# Patient Record
Sex: Male | Born: 1950 | Race: White | Hispanic: No | Marital: Married | Smoking: Never smoker
Health system: Southern US, Community
[De-identification: ages and names within clinical notes are randomized; demographics above are authoritative.]

## PROBLEM LIST (undated history)

## (undated) DIAGNOSIS — F32A Depression, unspecified: Secondary | ICD-10-CM

## (undated) DIAGNOSIS — I10 Essential (primary) hypertension: Secondary | ICD-10-CM

## (undated) DIAGNOSIS — F329 Major depressive disorder, single episode, unspecified: Secondary | ICD-10-CM

## (undated) DIAGNOSIS — I809 Phlebitis and thrombophlebitis of unspecified site: Secondary | ICD-10-CM

## (undated) HISTORY — DX: Phlebitis and thrombophlebitis of unspecified site: I80.9

## (undated) HISTORY — DX: Essential (primary) hypertension: I10

## (undated) HISTORY — DX: Depression, unspecified: F32.A

## (undated) HISTORY — DX: Major depressive disorder, single episode, unspecified: F32.9

---

## 1986-04-03 HISTORY — PX: APPENDECTOMY: SHX54

## 2006-04-19 ENCOUNTER — Ambulatory Visit: Payer: Self-pay | Admitting: Family Medicine

## 2006-04-20 ENCOUNTER — Encounter: Payer: Self-pay | Admitting: Vascular Surgery

## 2006-04-20 ENCOUNTER — Ambulatory Visit (HOSPITAL_COMMUNITY): Admission: RE | Admit: 2006-04-20 | Discharge: 2006-04-20 | Payer: Self-pay | Admitting: Family Medicine

## 2006-06-28 LAB — CONVERTED CEMR LAB
Cholesterol: 241 mg/dL
HDL: 47 mg/dL
LDL Cholesterol: 174 mg/dL

## 2008-02-18 LAB — CONVERTED CEMR LAB
HDL: 55 mg/dL
LDL Cholesterol: 155 mg/dL

## 2008-02-24 ENCOUNTER — Ambulatory Visit: Payer: Self-pay | Admitting: Family Medicine

## 2008-02-24 DIAGNOSIS — I10 Essential (primary) hypertension: Secondary | ICD-10-CM

## 2008-02-24 DIAGNOSIS — E785 Hyperlipidemia, unspecified: Secondary | ICD-10-CM | POA: Insufficient documentation

## 2008-06-25 LAB — CONVERTED CEMR LAB
Creatinine, Ser: 1.26 mg/dL
HDL: 49 mg/dL
Hemoglobin: 14.7 g/dL
PSA: 1.26 ng/mL
Triglyceride fasting, serum: 82 mg/dL

## 2008-09-21 ENCOUNTER — Encounter: Payer: Self-pay | Admitting: Family Medicine

## 2008-10-15 ENCOUNTER — Ambulatory Visit: Payer: Self-pay | Admitting: Family Medicine

## 2009-02-17 ENCOUNTER — Encounter (INDEPENDENT_AMBULATORY_CARE_PROVIDER_SITE_OTHER): Payer: Self-pay | Admitting: *Deleted

## 2009-03-01 ENCOUNTER — Encounter (INDEPENDENT_AMBULATORY_CARE_PROVIDER_SITE_OTHER): Payer: Self-pay | Admitting: *Deleted

## 2009-03-02 ENCOUNTER — Ambulatory Visit: Payer: Self-pay | Admitting: Internal Medicine

## 2009-03-19 ENCOUNTER — Ambulatory Visit: Payer: Self-pay | Admitting: Internal Medicine

## 2009-03-23 ENCOUNTER — Encounter: Payer: Self-pay | Admitting: Internal Medicine

## 2010-05-05 ENCOUNTER — Encounter: Payer: Self-pay | Admitting: *Deleted

## 2010-06-30 LAB — LIPID PANEL: Cholesterol: 221 mg/dL — AB (ref 0–200)

## 2010-08-17 ENCOUNTER — Ambulatory Visit (INDEPENDENT_AMBULATORY_CARE_PROVIDER_SITE_OTHER): Payer: 59 | Admitting: Family Medicine

## 2010-08-17 ENCOUNTER — Encounter: Payer: Self-pay | Admitting: Family Medicine

## 2010-08-17 DIAGNOSIS — I10 Essential (primary) hypertension: Secondary | ICD-10-CM

## 2010-08-17 DIAGNOSIS — Z8042 Family history of malignant neoplasm of prostate: Secondary | ICD-10-CM

## 2010-08-17 DIAGNOSIS — F329 Major depressive disorder, single episode, unspecified: Secondary | ICD-10-CM

## 2010-08-17 DIAGNOSIS — F3289 Other specified depressive episodes: Secondary | ICD-10-CM

## 2010-08-17 MED ORDER — HYDROCHLOROTHIAZIDE 25 MG PO TABS: 25.0000 mg | ORAL_TABLET | Freq: Every day | ORAL | Status: DC

## 2010-08-17 NOTE — Patient Instructions (Signed)
Continue to monitor your blood pressure.  If you would like to try an acei please call.  I'd recommend to continue to monitor your PSA for a sudden rise.  Keep up the biking and be safe!

## 2010-08-17 NOTE — Assessment & Plan Note (Signed)
Reasonable control with current medication.  Does have white coat rises. Discussed could try adding an acei if he desires.  Continue to monitor at home

## 2010-08-17 NOTE — Progress Notes (Signed)
  Subjective:    Patient ID: Edgar Maxwell, male    DOB: [birth year], 60 y.o.   MRN: [id number]  HPI  HYPERTENSION Disease Monitoring Blood pressure range-127-146/74-91 at home Chest pain- no      Dyspnea- no Medications Compliance- daily hctz but did not take this am Lightheadedness- no   Edema- no Exercising with biking regularly Working on weight loss  Prostate Cancer Risk Slowly rising PSA.  Strong family history.   Dicussed options for referral to urology.  He would like to watch  Hyperlipidemia No medications.  Watching his diet.   Results from recent blood work entered in abstract form.     Depression Improved with new medication. Feels well   Review of Systems Patient reports no  vision/ hearing changes,anorexia, weight change, fever ,adenopathy, persistant / recurrent hoarseness, swallowing issues, chest pain, edema,persistant / recurrent cough, hemoptysis, dyspnea(rest, exertional, paroxysmal nocturnal), gastrointestinal  bleeding (melena, rectal bleeding), abdominal pain, excessive heart burn, GU symptoms(dysuria, hematuria, pyuria, voiding/incontinence  Issues) syncope, focal weakness, severe memory loss, concerning skin lesions, depression, anxiety, abnormal bruising/bleeding, major joint swelling.       Objective:   Physical Exam    Neck:  No deformities, thyromegaly, masses, or tenderness noted.   Supple with full range of motion without pain. Heart - Regular rate and rhythm.  No murmurs, gallops or rubs.    Lungs:  Normal respiratory effort, chest expands symmetrically. Lungs are clear to auscultation, no crackles or wheezes. Abdomen: soft and non-tender without masses, organomegaly or hernias noted.  No guarding or rebound Rectal: normal size firm prostate without masses or irregularities Extremities:  No cyanosis, edema, or deformity noted with good range of motion of all major joints.   Skin:  Intact without suspicious lesions or rashes      Assessment & Plan:

## 2011-08-10 ENCOUNTER — Other Ambulatory Visit: Payer: Self-pay | Admitting: Family Medicine

## 2011-08-10 MED ORDER — HYDROCHLOROTHIAZIDE 25 MG PO TABS: 25.0000 mg | ORAL_TABLET | Freq: Every day | ORAL | Status: DC

## 2011-12-19 ENCOUNTER — Encounter: Payer: Self-pay | Admitting: Family Medicine

## 2012-03-13 ENCOUNTER — Ambulatory Visit (INDEPENDENT_AMBULATORY_CARE_PROVIDER_SITE_OTHER): Payer: 59 | Admitting: Family Medicine

## 2012-03-13 ENCOUNTER — Telehealth: Payer: Self-pay | Admitting: Family Medicine

## 2012-03-13 ENCOUNTER — Encounter: Payer: Self-pay | Admitting: Family Medicine

## 2012-03-13 ENCOUNTER — Ambulatory Visit (HOSPITAL_COMMUNITY)
Admission: RE | Admit: 2012-03-13 | Discharge: 2012-03-13 | Disposition: A | Discharge status: Home / Self Care | Payer: 59 | Source: Ambulatory Visit | Attending: Family Medicine | Admitting: Family Medicine

## 2012-03-13 VITALS — BP 144/77 | HR 84 | Temp 98.9°F | Ht 72.0 in | Wt 223.0 lb

## 2012-03-13 DIAGNOSIS — I499 Cardiac arrhythmia, unspecified: Secondary | ICD-10-CM | POA: Insufficient documentation

## 2012-03-13 DIAGNOSIS — R002 Palpitations: Secondary | ICD-10-CM | POA: Insufficient documentation

## 2012-03-13 LAB — BASIC METABOLIC PANEL
BUN: 18 mg/dL (ref 6–23)
Calcium: 9.1 mg/dL (ref 8.4–10.5)
Creat: 0.93 mg/dL (ref 0.50–1.35)
Glucose, Bld: 107 mg/dL — ABNORMAL HIGH (ref 70–99)

## 2012-03-13 NOTE — Assessment & Plan Note (Addendum)
He has had 2 episodes of heart palpitations lasting 5-10 minutes over the past 24 hours. His ECG today in clinic (when he was not experiencing symptoms) was normal.  We will check BMET and TSH to rule-out electrolyte and thyroid abnormalities.  We discussed Holter monitoring/cardiology referral. He would like to defer at this time; he will consider if palpitations persist.  Also on differential is recent stress (string of bad call nights) which may be contributing to symptoms.

## 2012-03-13 NOTE — Telephone Encounter (Signed)
Dr Raymon Mutton called is having frequent episode of palpitations over the last few days.  No chest pain or shortness of breath but is bothersome.  Suggested that he be seen today for an ECG and probable blood tests given his hypertension and diuretic usage  He agrees

## 2012-03-13 NOTE — Progress Notes (Signed)
  Subjective:    Patient ID: Edgar Maxwell, male    DOB: 1951/02/09, 61 y.o.   MRN: [id number]  HPI # Palpitations  He comes in today after experiencing his second episode of palpitations.  He had his first last night while at home while he was walking down the hall. It did not improve with rest. He is wondering if he was having PVCs or PACs at that time; he felt his heartbeat was rapid. It resolved after about 5-10 minutes.  His second episode occurred today while sitting at his desk at work. He denies inciting or alleviating factors. It spontaneously resolved after about 5-10 minutes.   He denies new medications.  He is compliant with HCTZ for blood pressure.   He denies any symptoms at this time. During time of episode, he denies chest pain, dyspnea, nausea, leg swelling or pain. He denies fevers/chills/constipation/abdominal pain.   He is wondering if his symptoms could be attributable due to stress. He has had a series of bad call nights recently (he is one of the Pediatric ICU attendings).  Review of Systems Per HPI   Allergies, medication, past medical history reviewed.  Depression--he reports good control. Lamictal added to Pristiq in January.   Pertinent family history: Denies history of cardiac disease before the age of 37. Grandfather died from MI mid to late 56s.  Father diagnosed with AF age 29 and has questionable diagnosis of HOCUM.     Objective:   Physical Exam GEN: NAD; well-nourished, -appearing PSYCH: pleasant, normally socially appropriate CV: RRR, normal S1/S2, no murmurs or gallops PULM: NI WOB; CTAB without rales EXT: no edema NEURO: grossly normal  ECG: normal sinus rhythm  Rate: regular Rhythm: regular Axis: normal left-axis Intervals:    PR (normal 0.12-0.20): normal P-wave (normal <0.12 duration, 0.25 mv amplitude): normal QRS complex (normal 0.06-0.10): normal  QTc: 446 no pathologic Q waves, signs of BBB, or hypertrophy ST-T waves: no elevated or  depressed    Assessment & Plan:

## 2012-03-13 NOTE — Patient Instructions (Addendum)
We will call you with the lab results.   If this occurs again, we may consider a Holter monitor.

## 2012-03-14 ENCOUNTER — Telehealth: Payer: Self-pay | Admitting: Family Medicine

## 2012-03-14 LAB — TSH: TSH: 2.079 u[IU]/mL (ref 0.350–4.500)

## 2012-03-14 NOTE — Telephone Encounter (Signed)
Notified of normal BMET and TSH.   He is feeling okay. He did have another small episode this morning, but his symptoms were not as severe and he did not get lightheaded.   He drinks about 2 cups of coffee daily. He has not changed his intake recently. He is wondering also if this could be contributing to symptoms.   He was advised to follow-up as needed if episodes persist or symptoms worsen.

## 2012-05-18 ENCOUNTER — Other Ambulatory Visit: Payer: Self-pay

## 2012-07-22 ENCOUNTER — Encounter: Payer: Self-pay | Admitting: Family Medicine

## 2012-07-22 ENCOUNTER — Telehealth: Payer: Self-pay | Admitting: Family Medicine

## 2012-07-22 MED ORDER — LAMOTRIGINE 100 MG PO TABS: 100.0000 mg | ORAL_TABLET | Freq: Every day | ORAL | Status: DC

## 2012-07-22 MED ORDER — HYDROCHLOROTHIAZIDE 25 MG PO TABS: 25.0000 mg | ORAL_TABLET | Freq: Every day | ORAL | Status: DC

## 2012-07-22 NOTE — Telephone Encounter (Signed)
Hi Lee,  Hope you are doing well. I have had no further episodes of palpitations. Job related stress, I assume.  Would you please send a couple of 'scrips to the out-patient pharmacy for me?  HCTZ 25 mg qd and Lamotrigine 100 mg qd  Thanks so much,  [name]

## 2012-07-23 ENCOUNTER — Encounter: Payer: Self-pay | Admitting: *Deleted

## 2012-07-24 ENCOUNTER — Encounter: Payer: Self-pay | Admitting: Family Medicine

## 2012-08-28 ENCOUNTER — Ambulatory Visit (INDEPENDENT_AMBULATORY_CARE_PROVIDER_SITE_OTHER): Payer: 59 | Admitting: Internal Medicine

## 2012-08-28 ENCOUNTER — Encounter: Payer: Self-pay | Admitting: Internal Medicine

## 2012-08-28 ENCOUNTER — Encounter (INDEPENDENT_AMBULATORY_CARE_PROVIDER_SITE_OTHER): Payer: 59

## 2012-08-28 VITALS — BP 150/98 | HR 69 | Ht 72.0 in | Wt 233.0 lb

## 2012-08-28 DIAGNOSIS — E785 Hyperlipidemia, unspecified: Secondary | ICD-10-CM

## 2012-08-28 DIAGNOSIS — I421 Obstructive hypertrophic cardiomyopathy: Secondary | ICD-10-CM

## 2012-08-28 DIAGNOSIS — R002 Palpitations: Secondary | ICD-10-CM

## 2012-08-28 DIAGNOSIS — Z8249 Family history of ischemic heart disease and other diseases of the circulatory system: Secondary | ICD-10-CM

## 2012-08-28 DIAGNOSIS — I440 Atrioventricular block, first degree: Secondary | ICD-10-CM

## 2012-08-28 DIAGNOSIS — I10 Essential (primary) hypertension: Secondary | ICD-10-CM

## 2012-08-28 DIAGNOSIS — I471 Supraventricular tachycardia: Secondary | ICD-10-CM

## 2012-08-28 NOTE — Patient Instructions (Addendum)
Your physician has requested that you have an echocardiogram. Echocardiography is a painless test that uses sound waves to create images of your heart. It provides your doctor with information about the size and shape of your heart and how well your heart's chambers and valves are working. This procedure takes approximately one hour. There are no restrictions for this procedure.  Your physician has recommended that you wear a 30 day event monitor. Event monitors are medical devices that record the heart's electrical activity. Doctors most often [place] these monitors to diagnose arrhythmias. Arrhythmias are problems with the speed or rhythm of the heartbeat. The monitor is a small, portable device. You can wear one while you do your normal daily activities. This is usually used to diagnose what is causing palpitations/syncope (passing out).  Your physician recommends that you schedule a follow-up appointment as needed.

## 2012-08-28 NOTE — Progress Notes (Signed)
ELECTROPHYSIOLOGY CONSULT NOTE  Patient ID: Edgar Maxwell, MRN: [id number], DOB/AGE: Oct 09, 1950 62 y.o. Admit date: (Not on file) Date of Consult: 08/28/2012  Primary Physician: Carney Living, MD Primary Cardiologist: new  Chief Complaint    HPI Edgar Maxwell is a 62 y.o. male ( note abbreviated b/c written one week late--i will need to clarify with pt the details) Presented with tachypalps precipitated by biking-which he does avidly. HR monitor showed abrupt elevations in HR that were persistent but not uniform suggesting irregularity, these measured off his GPS  He also has a palpitation syndrome which has not been assoc with exertion, typically lasting 5-10 min.  These were both irregular and rapid  He has some post exercise orthostatic lightheadedness, but not with exertion  He has some HTN for which he is treated.  Family Hx ? HCM   Prior hx of some mild edema    Past Medical History  Diagnosis Date  . Phlebitis       Surgical History: No past surgical history on file.   Home Meds: Prior to Admission medications   Medication Sig Start Date End Date Taking? Authorizing Provider  desvenlafaxine (PRISTIQ) 50 MG 24 hr tablet Take 50 mg by mouth daily.     Yes Historical Provider, MD  hydrochlorothiazide (HYDRODIURIL) 25 MG tablet Take 1 tablet (25 mg total) by mouth daily. Every morning 07/22/12  Yes Carney Living, MD  lamoTRIgine (LAMICTAL) 100 MG tablet Take 1 tablet (100 mg total) by mouth daily. 07/22/12  Yes Carney Living, MD     Allergies: No Known Allergies  History   Social History  . Marital Status: Married    Spouse Name: Edgar Maxwell    Number of Children: Edgar Maxwell  . Years of Education: Edgar Maxwell   Occupational History  . Not on file.   Social History Main Topics  . Smoking status: Former Games developer  . Smokeless tobacco: Not on file  . Alcohol Use: Not on file  . Drug Use: Not on file  . Sexually Active: Not on file   Other Topics Concern  . 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   Remarried - wife works at [employer] - Bilateral mastectomy for cancer in 2011   Has three daughters born in mid- late 53s - 2 at [employer] in [place] in 2012   Exwife in [place]     Family History  Problem Relation Age of Onset  . Prostate cancer Father 65    Died of prostate cancer  . Atrial fibrillation Father     normal coronaries  . Hypertension Father     and brother     ROS:  Please see the history of present illness.     All other systems reviewed and negative.    Physical Exam   Height 6' (1.829 m), weight 233 lb (105.688 kg). BP 150/98  Pulse 69  Ht 6' (1.829 m)  Wt 233 lb (105.688 kg)  BMI 31.59 kg/m2  SpO2 94%  General: Well developed, well nourished male in no acute distress. Head: Normocephalic, atraumatic, sclera non-icteric, no xanthomas, nares are without discharge. EENT: normal Lymph Nodes:  none Back: without scoliosis/kyphosis , no CVA tendersness Neck: Negative for carotid bruits. JVD not elevated. Lungs: Clear bilaterally to auscultation without wheezes, rales, or rhonchi. Breathing is unlabored. Heart: Irregular pulse with S1 S2. No   murmur , rubs, or gallops appreciated. Abdomen: Soft, non-tender, non-distended with normoactive  bowel sounds. No hepatomegaly. No rebound/guarding. No obvious abdominal masses. Msk:  Strength and tone appear normal for age. Extremities: No clubbing or cyanosis. No edema.  Distal pedal pulses are 2+ and equal bilaterally. Skin: Warm and Dry Neuro: Alert and oriented X 3. CN III-XII intact Grossly normal sensory and motor function . Psych:  Responds to questions appropriately with a normal affect.      Labs: Cardiac Enzymes No results found for this basename: CKTOTAL, CKMB, TROPONINI,  in the last 72 hours CBC Lab Results  Component Value Date   HGB 14.7 06/25/2008   PROTIME: No results found for this basename: LABPROT, INR,  in the last  72 hours Chemistry No results found for this basename: NA, K, CL, CO2, BUN, CREATININE, CALCIUM, LABALBU, PROT, BILITOT, ALKPHOS, ALT, AST, GLUCOSE,  in the last 168 hours Lipids Lab Results  Component Value Date   CHOL 221* 06/30/2010   HDL 45 06/30/2010   LDLCALC 146 06/30/2010   BNP No results found for this basename: probnp   Miscellaneous No results found for this basename: DDIMER    Radiology/Studies:  No results found.  EKG:NSR @ 67with freq PAC   Intervals 16/09/41  Assessment and Plan:    [name]

## 2012-08-29 ENCOUNTER — Encounter: Payer: Self-pay | Admitting: *Deleted

## 2012-08-29 ENCOUNTER — Other Ambulatory Visit: Payer: Self-pay

## 2012-08-29 DIAGNOSIS — I10 Essential (primary) hypertension: Secondary | ICD-10-CM

## 2012-08-29 DIAGNOSIS — I421 Obstructive hypertrophic cardiomyopathy: Secondary | ICD-10-CM

## 2012-08-29 NOTE — Progress Notes (Signed)
Patient ID: Edgar Maxwell, male   DOB: 20-Jun-1950, 62 y.o.   MRN: [id number] Patient enrolled with [employer] for 30 Day Event monitor.  Monitor to be mailed to patient.

## 2012-09-02 ENCOUNTER — Telehealth: Payer: Self-pay | Admitting: Family Medicine

## 2012-09-02 NOTE — Telephone Encounter (Signed)
Edgar Maxwell,  That all sounds perfectly reasonable. As luck would have it, I cycled the last three days for a total of about three hours with max heart rates over 160 each time (the usual trigger for my arrhythmia). I stayed in sinus rhythm the whole time. I did have some post-ride palpitations once - interested to know if it was PACs or PVCs. Hopefully the event recorder will tell. No dizziness either.  My cardiac echo is next Tuesday. I'll keep you updated.  Thanks,  Edgar Maxwell    From: Edgar Maxwell  Sent: Monday, September 02, 2012 9:12 AM To: Edgar Maxwell Subject: RE: medical updates  Edgar Maxwell  Sorry to hear about all this.  I haven't gotten any notes as best I can tell Edgar Maxwell has not finished his yet.  Yes I agree better control would likely be beneficial.    We probably ought to wait for the echo and monitor results, if you are having that soon, before choosing a specific bp med.  As to cholesterol yes the new guidelines are a bit of a change.   You probably need to be on a moderate dose statin - likely Lipitor.  Perhaps you can schedule an vist with me once you finish your cardiac workup?  I am certainly open to what Edgar Maxwell would suggest also  Does that sound like an ok approach?    Thanks  Edgar Maxwell  From: Edgar Maxwell  Sent: Friday, Aug 30, 2012 11:46 AM To: Edgar Maxwell Subject: medical updates  Hi Edgar Maxwell  [facility] you are doing well. I don't know if you have gotten information via Epic about my recent cardiac issues. Several weeks ago I started having bouts of SVT while cycling (I always ride with a GPS/heart monitor device). They mostly happen early in rides with exertion (climbing hills) and are brief without other symptoms. That is until a more recent ride when I had multiple episodes and felt lousy afterwards. I was seen by Edgar Maxwell on [facility] 5/28. He has provided me a cardiac event monitor to wear while cycling (the only time it seems to happen). Due my father's history of possible  hypertrophic cardiomyopathy he has also scheduled an echocardiogram. If that is concerning or not definitive we will follow up with cardiac MRI. Of note, my father also had sudden onset of arrhythmia (atrial fib) at my current age. Edgar Maxwell thinks that is likely what I have given the variability in my heart rate during these episodes. I have also had some dizzy/near syncope spells, occurring hours after riding. My orthostatic testing was normal.  My initial BP was 148/92 (which, as always, is higher than my own BP checks at home). He thinks, and I agree, that we should probably be more aggressive about getting it under better control. Based on the new (and controversial) cardiac risk calculator, my 10 yr. risk is over 11%. In addition to better BP control, I think we should talk about getting my cholesterol under better control as well (total pretty consistent around 220 with HDL in low 50s).   Sorry to be so long-winded. Please let me know what you think. Thanks,  Edgar Maxwell

## 2012-09-05 DIAGNOSIS — R002 Palpitations: Secondary | ICD-10-CM | POA: Insufficient documentation

## 2012-09-05 DIAGNOSIS — Z8249 Family history of ischemic heart disease and other diseases of the circulatory system: Secondary | ICD-10-CM | POA: Insufficient documentation

## 2012-09-05 NOTE — Assessment & Plan Note (Addendum)
As above  If echo is equivocal , would pursue C MRI for reasons above  This diagnosis would also have an impact on issues related to hydration which relate to Dx o HCM Risk stratification with holter and GXT would also be appropriate if Dx is made Gene testing and cascade evaluation would follow

## 2012-09-05 NOTE — Assessment & Plan Note (Signed)
Used the new ASCVD risk estimator as an adjunct to the 2013 ACC/AHA Guideline on the Assessment of Cardiovascular Risk . This suggests a 10 year  11.6risk of hard cardiac event. Based on recommendations,  Low h intensity statin therapy is recommended  Will defer to dr [facility]

## 2012-09-05 NOTE — Assessment & Plan Note (Signed)
PT has documented by his GPS tachypalpitations which are non uniform in rate.  I suspect this is atrial fibrillation>> event recorder should be useful here and as these are mostly associated/triggered by exercise and we will use the device while riding.  I am also looking into the use of the mobile telemetry recorder that works with smart phones  If atrial fibrillation will need to address anticoagulation with CHADS score of 1.  The family history of HCM is important to explore as it would inform further decisions regarding anticoagulation and family assessment  To that end we will get an echo to look at LV and LA structure and function  It might also be useful to explore OSA given the close assoc of abd obesity, HTN afib and OSA

## 2012-09-10 ENCOUNTER — Ambulatory Visit (HOSPITAL_COMMUNITY): Payer: 59 | Attending: Cardiovascular Disease | Admitting: Radiology

## 2012-09-10 DIAGNOSIS — Z87891 Personal history of nicotine dependence: Secondary | ICD-10-CM | POA: Insufficient documentation

## 2012-09-10 DIAGNOSIS — I421 Obstructive hypertrophic cardiomyopathy: Secondary | ICD-10-CM | POA: Insufficient documentation

## 2012-09-10 DIAGNOSIS — E785 Hyperlipidemia, unspecified: Secondary | ICD-10-CM | POA: Insufficient documentation

## 2012-09-10 DIAGNOSIS — I10 Essential (primary) hypertension: Secondary | ICD-10-CM | POA: Insufficient documentation

## 2012-09-10 DIAGNOSIS — I471 Supraventricular tachycardia, unspecified: Secondary | ICD-10-CM | POA: Insufficient documentation

## 2012-09-10 NOTE — Progress Notes (Signed)
Echocardiogram performed.  

## 2012-09-12 ENCOUNTER — Encounter: Payer: Self-pay | Admitting: Pediatrics

## 2012-09-16 ENCOUNTER — Encounter: Payer: Self-pay | Admitting: Family Medicine

## 2012-09-16 ENCOUNTER — Telehealth: Payer: Self-pay | Admitting: Internal Medicine

## 2012-09-16 ENCOUNTER — Ambulatory Visit (INDEPENDENT_AMBULATORY_CARE_PROVIDER_SITE_OTHER): Payer: 59 | Admitting: Family Medicine

## 2012-09-16 VITALS — BP 147/93 | HR 69 | Ht 72.0 in | Wt 227.0 lb

## 2012-09-16 DIAGNOSIS — I10 Essential (primary) hypertension: Secondary | ICD-10-CM

## 2012-09-16 DIAGNOSIS — E785 Hyperlipidemia, unspecified: Secondary | ICD-10-CM

## 2012-09-16 DIAGNOSIS — F329 Major depressive disorder, single episode, unspecified: Secondary | ICD-10-CM

## 2012-09-16 MED ORDER — ATORVASTATIN CALCIUM 40 MG PO TABS: 40.0000 mg | ORAL_TABLET | Freq: Every day | ORAL | Status: DC

## 2012-09-16 MED ORDER — LISINOPRIL 10 MG PO TABS: 10.0000 mg | ORAL_TABLET | Freq: Every day | ORAL | Status: DC

## 2012-09-16 NOTE — Telephone Encounter (Signed)
Pt given results of recent echo.

## 2012-09-16 NOTE — Telephone Encounter (Signed)
In response to Dr Odessa Fleming question about family history of HCM pt states many years ago it was questionable in his father, never confirmed. Pt also asking, based on results of echo how does he proceed from here? Does Dr Graciela Husbands want to recommend Cardiac MRI? I will forward to Dr Graciela Husbands.

## 2012-09-16 NOTE — Patient Instructions (Addendum)
Come in for a bmet in 1 week after taking lisinopril  Come in for a fasting cholesterol in 6 weeks  Call or email if blood pressure is not almost always < 140/90  Geoffery Lyons or [employer]

## 2012-09-16 NOTE — Telephone Encounter (Signed)
Follow Up ° ° ° ° °Following up on test results. Please call. °

## 2012-09-17 NOTE — Progress Notes (Signed)
  Subjective:    Patient ID: Edgar Maxwell, male    DOB: 1950/10/13, 62 y.o.   MRN: [id number]  HPI  Tachycardia Having largely asymptomatic episode of tachycardia notes on his heart monitor when he bikes.   Has seen Dr Graciela Husbands for evaluation and echo  (see his notes).  Is now wearing an event recorder when rides but does not think he has had any episodes since wearing it.  No syncope or chest pain   HYPERTENSION Disease Monitoring Home BP Monitoring usually above 140/90 when seeing providers. Is lower at home.  He does not check at work Chest pain- no    Dyspnea- no Medications Compliance-  Daily hctz. Lightheadedness-  no  Edema- no ROS - See HPI  No history of angioedema  PMH Lab Review   Potassium  Date Value Range Status  03/13/2012 4.0  3.5 - 5.3 mEq/L Final     Sodium  Date Value Range Status  03/13/2012 141  135 - 145 mEq/L Final     Creat  Date Value Range Status  03/13/2012 0.93  0.50 - 1.35 mg/dL Final     Creatinine, Ser  Date Value Range Status  06/25/2008 1.26   Final       Cholesterol Never been treated with a statin.  Reports his 10 year calculated risk is 10%.  No liver problems.  No claudication or muscle aches.  Depression Seeing Dr Nolen Mu.  Concerned that pristiq maybe causing short term memory problems.  Lamotrigine seems to help mood stabilizing.  He would like to explore other opitons and referrals No indication of suicidal ideation   Review of Symptoms - see HPI  PMH - Smoking status noted.       Review of Systems     Objective:   Physical Exam no apparent distress        Assessment & Plan:

## 2012-09-17 NOTE — Assessment & Plan Note (Signed)
Given his risk of 10% will start moderate dose statin and monitor

## 2012-09-17 NOTE — Assessment & Plan Note (Signed)
Not at goal.  Will add acei and monitor blood pressure and labs.  May need betablocker depending on symptoms and heart monitor results

## 2012-09-17 NOTE — Assessment & Plan Note (Signed)
Not great control.  Gave names of possible providers

## 2012-09-27 ENCOUNTER — Other Ambulatory Visit: Payer: Self-pay | Admitting: Family Medicine

## 2012-09-28 LAB — TSH: TSH: 1.609 u[IU]/mL (ref 0.350–4.500)

## 2012-09-28 LAB — BASIC METABOLIC PANEL
BUN: 21 mg/dL (ref 6–23)
Calcium: 9.2 mg/dL (ref 8.4–10.5)
Creat: 0.92 mg/dL (ref 0.50–1.35)
Glucose, Bld: 106 mg/dL — ABNORMAL HIGH (ref 70–99)

## 2012-09-30 ENCOUNTER — Encounter: Payer: Self-pay | Admitting: Family Medicine

## 2012-09-30 ENCOUNTER — Telehealth: Payer: Self-pay | Admitting: Internal Medicine

## 2012-09-30 DIAGNOSIS — I421 Obstructive hypertrophic cardiomyopathy: Secondary | ICD-10-CM

## 2012-09-30 NOTE — Telephone Encounter (Signed)
Spoke with pt, he was following up with dr Graciela Husbands who told him he was going to have dr Regino Schultze at [facility] look at his echo. He also completed his monitor with no symptoms but since he stopped the monitor he has had episodes of heart rate of 200 while riding his bike, he is interested in loop recorder insertion. He also wants the number to billing regarding his bill from the monitor. Will discuss with dr Graciela Husbands and call the pt back.

## 2012-09-30 NOTE — Telephone Encounter (Signed)
New problem    Has question regarding echo results & event recorder .

## 2012-10-01 ENCOUNTER — Encounter: Payer: Self-pay | Admitting: Internal Medicine

## 2012-10-02 NOTE — Telephone Encounter (Signed)
Spoke with pt, per dr Graciela Husbands, order placed for cardiac MRI for HOCM.

## 2012-10-09 ENCOUNTER — Encounter: Payer: Self-pay | Admitting: Internal Medicine

## 2012-10-16 ENCOUNTER — Ambulatory Visit (HOSPITAL_COMMUNITY)
Admission: RE | Admit: 2012-10-16 | Discharge: 2012-10-16 | Disposition: A | Discharge status: Home / Self Care | Payer: 59 | Source: Ambulatory Visit | Attending: Internal Medicine | Admitting: Internal Medicine

## 2012-10-16 DIAGNOSIS — I4949 Other premature depolarization: Secondary | ICD-10-CM | POA: Insufficient documentation

## 2012-10-16 DIAGNOSIS — Z8249 Family history of ischemic heart disease and other diseases of the circulatory system: Secondary | ICD-10-CM | POA: Insufficient documentation

## 2012-10-16 DIAGNOSIS — I421 Obstructive hypertrophic cardiomyopathy: Secondary | ICD-10-CM

## 2012-10-16 MED ORDER — GADOBENATE DIMEGLUMINE 529 MG/ML IV SOLN
30.0000 mL | Freq: Once | INTRAVENOUS | Status: AC
  Administered 2012-10-16: 30 mL via INTRAVENOUS

## 2012-10-23 ENCOUNTER — Telehealth: Payer: Self-pay | Admitting: Internal Medicine

## 2012-10-23 ENCOUNTER — Encounter: Payer: Self-pay | Admitting: Internal Medicine

## 2012-10-23 NOTE — Telephone Encounter (Signed)
Spoke with pt, he would like to talk with dr Graciela Husbands regarding beta blocker and anticoagulation. Pt made aware have sent dr Graciela Husbands the info for he is in the hosp today doing procedures. Pt voiced understanding. Will wait ti hear from dr Graciela Husbands regarding med change.

## 2012-10-23 NOTE — Telephone Encounter (Signed)
New Prob     Pt has a questions regarding his last visit. Please page.

## 2012-10-25 ENCOUNTER — Telehealth: Payer: Self-pay | Admitting: Internal Medicine

## 2012-10-25 NOTE — Telephone Encounter (Signed)
New Prob     Pt would like to speak to nurse. Did not specify what call was regarding.

## 2012-10-25 NOTE — Telephone Encounter (Signed)
Spoke with dr Isidore Moos office, aware will have dr Graciela Husbands call him as soon as he gets to the office this afternoon after procedures.

## 2012-10-25 NOTE — Telephone Encounter (Signed)
SPOKE with Dr Raymon Mutton at his office CHADS-VASc score of 1-HTN   So no need for anticoagulation unless he has HCM   And have been in contact with Dr Regino Schultze at [facility] regarding the abnormal MRI  We will also arrange a visit with Dr Reyne Dumas at [facility] to discuss RFCA although the issue of HCM will inform this .

## 2012-11-26 ENCOUNTER — Encounter: Payer: Self-pay | Admitting: Family Medicine

## 2012-11-26 ENCOUNTER — Telehealth: Payer: Self-pay | Admitting: Internal Medicine

## 2012-11-26 DIAGNOSIS — I480 Paroxysmal atrial fibrillation: Secondary | ICD-10-CM | POA: Insufficient documentation

## 2012-11-26 NOTE — Telephone Encounter (Signed)
New Prob  Pt wants to speak with you. He did not say what it was concerning. He asked if you could page him, when you get a chance.

## 2012-11-26 NOTE — Telephone Encounter (Signed)
Event monitor end of service report placed in medical records for faxing.

## 2012-11-26 NOTE — Telephone Encounter (Signed)
Spoke with pt, he saw dr Macon Large at [facility] yesterday. They need [place] to fax an EKG that clearly shows atrial fib. Fax number is [phone].

## 2013-02-06 ENCOUNTER — Other Ambulatory Visit: Payer: Self-pay

## 2013-05-13 ENCOUNTER — Other Ambulatory Visit: Payer: Self-pay | Admitting: Family Medicine

## 2013-07-15 ENCOUNTER — Other Ambulatory Visit: Payer: Self-pay | Admitting: Family Medicine

## 2013-07-15 MED ORDER — DESVENLAFAXINE SUCCINATE ER 50 MG PO TB24: 50.0000 mg | ORAL_TABLET | Freq: Every day | ORAL | Status: DC

## 2013-08-04 ENCOUNTER — Telehealth: Payer: Self-pay | Admitting: Family Medicine

## 2013-08-04 MED ORDER — BUPROPION HCL ER (XL) 300 MG PO TB24: 300.0000 mg | ORAL_TABLET | Freq: Every day | ORAL | Status: DC

## 2013-08-04 MED ORDER — HYDROCHLOROTHIAZIDE 25 MG PO TABS: 25.0000 mg | ORAL_TABLET | Freq: Every day | ORAL | Status: DC

## 2013-08-04 MED ORDER — LAMOTRIGINE 200 MG PO TABS: 200.0000 mg | ORAL_TABLET | Freq: Every day | ORAL | Status: DC

## 2013-08-04 NOTE — Telephone Encounter (Signed)
Received email from patient in his PCP's absence, requesting refiills of medications originally prescribed by Dr. Archer AsaGerald Plovsky.  Refills to be issued in quantity to suffice until Dr Deirdre Priesthambliss is able to address:  "Dr. Mauricio PoBreen,  Oda CoganLee Chambliss is my primary physician. I wrote him a nice long email early last week detailing my desire to consolidate my prescriptions (to cut down on my frequent visits to our out-patient pharmacy). I didn't learn until today that he is out until the 18th. I would greatly appreciate your assistance in the interim.  I need a renewal of HCTZ 25 mg qd, #90 x 3 refills. I have two other scripts that were originally written by Dr. Archer AsaGerald Plovsky ([facility], [phone]). They are for Lamotrigine 200 mg qd (increased from 100 mg qd) and Bupropion XL 300 mg qd. I was hoping to have each of these written #90 with 3 refills. I am completely out of these meds.  I would greatly appreciate if you could bail me out of my procrastination induced bind!  Please page me at [phone] with any questions. Thanks so much,  Edgar AblesMark   [name], MD, FAAP Director, Pediatric Critical Care [facility]  [address], [address] [place], [place] [address]   [phone] office [phone] pager [phone] fax [phone] cell"

## 2013-08-04 NOTE — Telephone Encounter (Signed)
Patient called about medication refill,he need refill of his HCTZ 25 mg qd 90 day supply, also requesting for Lamictal 200mg  and Buspur XL 300mg , these two medications are not on his med list except for Lamictal 100mg  instead of 200mg . He stated this was switched by his Psychiatrist. I recommended him contacting his Psychiatrist for refill of these meds while I refill his HCTZ on behalf of Dr Deirdre Priesthambliss, he agreed with plan.

## 2013-08-14 ENCOUNTER — Encounter: Payer: Self-pay | Admitting: Family Medicine

## 2013-08-15 MED ORDER — LISINOPRIL 10 MG PO TABS: 10.0000 mg | ORAL_TABLET | Freq: Every day | ORAL | Status: DC

## 2013-08-15 MED ORDER — HYDROCHLOROTHIAZIDE 25 MG PO TABS: 25.0000 mg | ORAL_TABLET | Freq: Every day | ORAL | Status: DC

## 2013-08-15 MED ORDER — METOPROLOL SUCCINATE ER 25 MG PO TB24: 25.0000 mg | ORAL_TABLET | Freq: Every day | ORAL | Status: DC

## 2013-09-08 ENCOUNTER — Encounter: Payer: Self-pay | Admitting: Family Medicine

## 2013-09-08 DIAGNOSIS — E785 Hyperlipidemia, unspecified: Secondary | ICD-10-CM

## 2013-09-09 MED ORDER — ATORVASTATIN CALCIUM 40 MG PO TABS: 40.0000 mg | ORAL_TABLET | Freq: Every day | ORAL | Status: DC

## 2013-10-31 ENCOUNTER — Other Ambulatory Visit: Payer: Self-pay | Admitting: Family Medicine

## 2013-12-30 ENCOUNTER — Other Ambulatory Visit: Payer: Self-pay | Admitting: *Deleted

## 2013-12-30 MED ORDER — LAMOTRIGINE 200 MG PO TABS: 200.0000 mg | ORAL_TABLET | Freq: Every day | ORAL | Status: DC

## 2014-01-21 ENCOUNTER — Ambulatory Visit (INDEPENDENT_AMBULATORY_CARE_PROVIDER_SITE_OTHER): Payer: 59 | Admitting: Family Medicine

## 2014-01-21 ENCOUNTER — Encounter: Payer: Self-pay | Admitting: Family Medicine

## 2014-01-21 VITALS — BP 132/84 | HR 73 | Temp 98.6°F | Ht 72.0 in | Wt 212.0 lb

## 2014-01-21 DIAGNOSIS — I10 Essential (primary) hypertension: Secondary | ICD-10-CM

## 2014-01-21 DIAGNOSIS — I480 Paroxysmal atrial fibrillation: Secondary | ICD-10-CM

## 2014-01-21 DIAGNOSIS — E785 Hyperlipidemia, unspecified: Secondary | ICD-10-CM

## 2014-01-21 LAB — CBC
HCT: 38.5 % — ABNORMAL LOW (ref 39.0–52.0)
HEMOGLOBIN: 13.1 g/dL (ref 13.0–17.0)
MCH: 30.7 pg (ref 26.0–34.0)
MCHC: 34 g/dL (ref 30.0–36.0)
MCV: 90.2 fL (ref 78.0–100.0)
PLATELETS: 220 10*3/uL (ref 150–400)
RBC: 4.27 MIL/uL (ref 4.22–5.81)
RDW: 13.6 % (ref 11.5–15.5)
WBC: 5.7 10*3/uL (ref 4.0–10.5)

## 2014-01-21 NOTE — Progress Notes (Signed)
   Subjective:    Patient ID: Edgar Maxwell, male    DOB: 07/06/1950, 63 y.o.   MRN: [id number]  HPI  Here for follow up  HYPERTENSION Disease Monitoring Home BP Monitoring 120/80s Chest pain- no    Dyspnea- no Medications Compliance-  daily. Lightheadedness-  Occasionally only after exercise will feel lightheadness when stands.  No syncope  Edema- no ROS - See HPI  Paroxysmal Afib - he does not hve any symptoms.  [facility] in abalation study.  Considering whether to start anticoagulation.  Will follow up with Dr Graciela HusbandsKlein  Depression  Has not see his psychiatrist for a while.  Feels his depression is stable although oftne will worsen in winter.  Riding bike helps.  Taking medications regualarly  Memory Having occasional episodes of word finding difficulty which maybe becoming more frequent.  No problems at work or other cognitive symptoms or focal neuro symptoms - focal weakness or vision changes    HYPERLIPIDEMIA Symptoms Chest pain on exertion:  No   Leg claudication:   no Medications: Compliance- daily atrorv Right upper quadrant pain- no  Muscle aches- no     Component Value Date/Time   CHOL 221* 06/30/2010   HDL 45 06/30/2010    PMH Lab Review   Potassium  Date Value Ref Range Status  09/27/2012 4.0  3.5 - 5.3 mEq/L Final     Sodium  Date Value Ref Range Status  09/27/2012 139  135 - 145 mEq/L Final     Creat  Date Value Ref Range Status  09/27/2012 0.92  0.50 - 1.35 mg/dL Final     Creatinine, Ser  Date Value Ref Range Status  06/25/2008 1.26   Final     Chief Complaint noted Review of Symptoms - see HPI PMH - Smoking status noted.   Vital Signs reviewed  Review of Systems     Objective:   Physical Exam  Alert no acute distress knows history and medications well  Psych:  Cognition and judgment appear intact. Alert, communicative  and cooperative with normal attention span and concentration. No apparent delusions, illusions,  hallucinations Heart - Regular rate and rhythm.  No murmurs, gallops or rubs.    Extrem - no edema       Assessment & Plan:   Memory Issues - seems most related to busy lifestyle.  No evidence specific neurologic issue.  Will check labs and ask him to discuss with his psychiatrist particularty about medications. Other wise willl follow

## 2014-01-21 NOTE — Assessment & Plan Note (Signed)
Follow up with Dr Graciela HusbandsKlein.  We discussed pros and cons of possible therapies

## 2014-01-21 NOTE — Assessment & Plan Note (Signed)
Well controlled.  Episodic lightheadness after exercise with standing likely vasodilation.  Since not progessive or happening other times no further work up.  Cautioned to rise slowly after exercise BP Readings from Last 3 Encounters:  01/21/14 132/84  09/16/12 147/93  08/28/12 150/98

## 2014-01-21 NOTE — Patient Instructions (Signed)
Good to see you today!  Thanks for coming in.  Follow your suggestions

## 2014-01-21 NOTE — Assessment & Plan Note (Signed)
Check labs since has been several years

## 2014-01-22 ENCOUNTER — Encounter: Payer: Self-pay | Admitting: Family Medicine

## 2014-01-22 LAB — LIPID PANEL
CHOL/HDL RATIO: 2.4 ratio
CHOLESTEROL: 141 mg/dL (ref 0–200)
HDL: 59 mg/dL (ref >39)
LDL Cholesterol: 68 mg/dL (ref 0–99)
Triglycerides: 72 mg/dL (ref <150)
VLDL: 14 mg/dL (ref 0–40)

## 2014-01-22 LAB — COMPREHENSIVE METABOLIC PANEL
ALBUMIN: 4.2 g/dL (ref 3.5–5.2)
ALT: 26 U/L (ref 0–53)
AST: 20 U/L (ref 0–37)
Alkaline Phosphatase: 44 U/L (ref 39–117)
BILIRUBIN TOTAL: 0.6 mg/dL (ref 0.2–1.2)
BUN: 24 mg/dL — AB (ref 6–23)
CO2: 26 meq/L (ref 19–32)
Calcium: 9.3 mg/dL (ref 8.4–10.5)
Chloride: 105 mEq/L (ref 96–112)
Creat: 1 mg/dL (ref 0.50–1.35)
GLUCOSE: 94 mg/dL (ref 70–99)
POTASSIUM: 4.2 meq/L (ref 3.5–5.3)
SODIUM: 139 meq/L (ref 135–145)
TOTAL PROTEIN: 7.4 g/dL (ref 6.0–8.3)

## 2014-02-10 ENCOUNTER — Other Ambulatory Visit: Payer: Self-pay | Admitting: Occupational Medicine

## 2014-02-10 ENCOUNTER — Ambulatory Visit: Payer: Self-pay

## 2014-02-10 DIAGNOSIS — R7612 Nonspecific reaction to cell mediated immunity measurement of gamma interferon antigen response without active tuberculosis: Secondary | ICD-10-CM

## 2014-04-01 ENCOUNTER — Encounter: Payer: Self-pay | Admitting: Internal Medicine

## 2014-04-02 ENCOUNTER — Other Ambulatory Visit: Payer: Self-pay | Admitting: Family Medicine

## 2014-04-20 ENCOUNTER — Other Ambulatory Visit: Payer: Self-pay | Admitting: Family Medicine

## 2014-04-20 DIAGNOSIS — R55 Syncope and collapse: Secondary | ICD-10-CM

## 2014-04-20 DIAGNOSIS — R5383 Other fatigue: Secondary | ICD-10-CM

## 2014-04-20 NOTE — Progress Notes (Unsigned)
Fatigue and Difficulty Concentrating Memory Loss Has noticed difficulty concentrating and remembering for about a year has been worsening according to his wife.  Worse when he is frustrated or rushed. Instances include putting electronics together which used to be easy for him and remembering about errands etc.   Has not noticed any difficulties at work    No other symptoms of weakness, visual changes, tremor, syncope, sleep problems, coordination problems  PMH - poroxysmal afib with rapid heart rate in past.  No recently  FH - mother has memory loss. Father might have had dementia, none in younger siblings  Physical Neurologic exam : Cn 2-7 intact Strength equal & normal in upper & lower extremities Able to walk on heels and toes.   Balance normal  Romberg normal, finger to nose  Eye - Pupils Equal Round Reactive to light, Extraocular movements intact, Fundi without hemorrhage or visible lesions, Conjunctiva without redness or discharge Heart - Regular rate and rhythm.  No murmurs, gallops or rubs.    Lungs:  Normal respiratory effort, chest expands symmetrically. Lungs are clear to auscultation, no crackles or wheezes.

## 2014-04-22 ENCOUNTER — Other Ambulatory Visit: Payer: Self-pay | Admitting: Family Medicine

## 2014-04-22 DIAGNOSIS — R55 Syncope and collapse: Secondary | ICD-10-CM | POA: Insufficient documentation

## 2014-04-23 LAB — CBC
HCT: 40.9 % (ref 39.0–52.0)
HEMOGLOBIN: 13.5 g/dL (ref 13.0–17.0)
MCH: 30.6 pg (ref 26.0–34.0)
MCHC: 33 g/dL (ref 30.0–36.0)
MCV: 92.7 fL (ref 78.0–100.0)
MPV: 9.4 fL (ref 8.6–12.4)
PLATELETS: 233 10*3/uL (ref 150–400)
RBC: 4.41 MIL/uL (ref 4.22–5.81)
RDW: 12.9 % (ref 11.5–15.5)
WBC: 5 10*3/uL (ref 4.0–10.5)

## 2014-04-23 LAB — RPR

## 2014-04-23 LAB — COMPREHENSIVE METABOLIC PANEL
ALBUMIN: 4.3 g/dL (ref 3.5–5.2)
ALT: 20 U/L (ref 0–53)
AST: 22 U/L (ref 0–37)
Alkaline Phosphatase: 48 U/L (ref 39–117)
BILIRUBIN TOTAL: 0.7 mg/dL (ref 0.2–1.2)
BUN: 25 mg/dL — AB (ref 6–23)
CHLORIDE: 104 meq/L (ref 96–112)
CO2: 23 meq/L (ref 19–32)
Calcium: 9.4 mg/dL (ref 8.4–10.5)
Creat: 1.1 mg/dL (ref 0.50–1.35)
Glucose, Bld: 93 mg/dL (ref 70–99)
POTASSIUM: 4.5 meq/L (ref 3.5–5.3)
Sodium: 138 mEq/L (ref 135–145)
Total Protein: 7.5 g/dL (ref 6.0–8.3)

## 2014-04-24 LAB — TSH: TSH: 2.342 u[IU]/mL (ref 0.350–4.500)

## 2014-04-24 LAB — FOLATE: Folate: 13.6 ng/mL

## 2014-04-24 LAB — VITAMIN B12: Vitamin B-12: 422 pg/mL (ref 211–911)

## 2014-04-24 LAB — HIV ANTIBODY (ROUTINE TESTING W REFLEX): HIV 1&2 Ab, 4th Generation: NONREACTIVE

## 2014-04-27 ENCOUNTER — Encounter: Payer: Self-pay | Admitting: Family Medicine

## 2014-04-28 ENCOUNTER — Other Ambulatory Visit: Payer: Self-pay

## 2014-04-30 ENCOUNTER — Telehealth: Payer: Self-pay | Admitting: Family Medicine

## 2014-04-30 NOTE — Telephone Encounter (Signed)
Pt called radiology and was able to get into [facility] tomorrow morning for her MRI. Chara Marquard,CMA

## 2014-04-30 NOTE — Telephone Encounter (Signed)
Patient is wanting to talk to nurse regarding his MRI and neurologist appointments.  He thinks that they were scheduled in the wrong order.  Please call him.

## 2014-05-01 ENCOUNTER — Ambulatory Visit (HOSPITAL_COMMUNITY)
Admission: RE | Admit: 2014-05-01 | Discharge: 2014-05-01 | Disposition: A | Discharge status: Home / Self Care | Payer: 59 | Source: Ambulatory Visit | Attending: Family Medicine | Admitting: Family Medicine

## 2014-05-01 DIAGNOSIS — R55 Syncope and collapse: Secondary | ICD-10-CM | POA: Insufficient documentation

## 2014-05-01 MED ORDER — GADOBENATE DIMEGLUMINE 529 MG/ML IV SOLN
19.0000 mL | Freq: Once | INTRAVENOUS | Status: AC | PRN
  Administered 2014-05-01: 19 mL via INTRAVENOUS

## 2014-05-05 ENCOUNTER — Ambulatory Visit (INDEPENDENT_AMBULATORY_CARE_PROVIDER_SITE_OTHER): Payer: Self-pay | Admitting: Neurology

## 2014-05-05 ENCOUNTER — Ambulatory Visit (HOSPITAL_COMMUNITY): Payer: 59

## 2014-05-05 DIAGNOSIS — R413 Other amnesia: Secondary | ICD-10-CM

## 2014-05-05 NOTE — Progress Notes (Signed)
No show

## 2014-05-06 ENCOUNTER — Ambulatory Visit (INDEPENDENT_AMBULATORY_CARE_PROVIDER_SITE_OTHER): Payer: 59 | Admitting: Neurology

## 2014-05-06 ENCOUNTER — Encounter: Payer: Self-pay | Admitting: Neurology

## 2014-05-06 VITALS — BP 130/82 | HR 73 | Ht 72.0 in | Wt 215.0 lb

## 2014-05-06 DIAGNOSIS — R410 Disorientation, unspecified: Secondary | ICD-10-CM

## 2014-05-06 DIAGNOSIS — R413 Other amnesia: Secondary | ICD-10-CM

## 2014-05-06 DIAGNOSIS — R4189 Other symptoms and signs involving cognitive functions and awareness: Secondary | ICD-10-CM

## 2014-05-06 DIAGNOSIS — F05 Delirium due to known physiological condition: Secondary | ICD-10-CM

## 2014-05-06 NOTE — Patient Instructions (Signed)
Overall you are doing fairly well but I do want to suggest a few things today:   Remember to drink plenty of fluid, eat healthy meals and do not skip any meals. Try to eat protein with a every meal and eat a healthy snack such as fruit or nuts in between meals. Try to keep a regular sleep-wake schedule and try to exercise daily, particularly in the form of walking, 20-30 minutes a day, if you can.   As far as your medications are concerned, I would like to suggest: no changes  As far as diagnostic testing: EEG (can consider in-home extended testing), Labwork, Referral for neurocognitive testing with Dr. Leonides CaveZelson  I would like to see you back after testing, sooner if we need to. Please call us with any interim questions, concerns, problems, updates or refill requests.   Please also call us for any test results so we can go over those with you on the phone.  My clinical assistant and will answer any of your questions and relay your messages to me and also relay most of my messages to you.   Our phone number is [phone]. We also have an after hours call service for urgent matters and there is a physician on-call for urgent questions. For any emergencies you know to call 911 or go to the nearest emergency room

## 2014-05-06 NOTE — Procedures (Signed)
    History:  Edgar Maxwell is a 64 year old gentleman with a history of atrial fibrillation diagnosed 1-1/2 years ago, with a more recent onset of some difficulty with memory that has been progressive over time. He is being evaluated for this issue.  This is a routine EEG. No skull defects are noted. Medications include Lipitor, Wellbutrin, Pristiq, Cardizem, hydrochlorothiazide, Lamictal, with Cipro, and metoprolol.    EEG classification: Normal awake and drowsy  Description of the recording: The background rhythms of this recording consists of a fairly well modulated medium amplitude alpha rhythm of 11 Hz that is reactive to eye opening and closure. As the record progresses, the patient appears to remain in the waking state throughout the recording. Photic stimulation was performed, resulting in a bilateral and symmetric photic driving response. Hyperventilation was also performed, resulting in a minimal buildup of the background rhythm activities without significant slowing seen. Toward the end of the recording, the patient enters the drowsy state with slight symmetric slowing seen. The patient never enters stage II sleep. At no time during the recording does there appear to be evidence of spike or spike wave discharges or evidence of focal slowing. EKG monitor shows no evidence of cardiac rhythm abnormalities with a heart rate of 66.  Impression: This is a normal EEG recording in the waking and drowsy state. No evidence of ictal or interictal discharges are seen.

## 2014-05-06 NOTE — Progress Notes (Addendum)
[facility]    Provider:  Dr Lucia GaskinsAhern Referring Provider: Carney Livinghambliss, Marshall L, * Primary Care Physician:  Carney LivingHAMBLISS,MARSHALL L, MD  CC:  Cognitive changes  HPI:  Edgar Maxwell is a 64 y.o. male here as a referral from Dr. Deirdre Priesthambliss for cognitive changes. He has a PMHx of afib (Dxed 1.5 years ago), depression, HLD . Wife accompanies and provides much of the information. She noticed symptoms 5 years ago which are progressive. He has a word and he can't come up with it, word-finding doifficulties. Simple things are difficult for him. As far as he knows there is no impact at work at all. First noticed changes with remembering things. He will ask a question then 30 seconds later will ask the same question. No recollection he asked it the first time. He will ask the same things over and over. The last 6-8 months have been very confused about the simplest things. For example, they were building a shelf and he left to find a tool, 20 minutes he had been searching for something and then he forgot what he was doing and started another task while the wife was still waiting for him to get the tool.  The 2 most striking things most recently, he put the bread in the toaster pulled the bread out and couldn't remember how to get the toast. They were doing a project in the back yard and 15-20 minutes later he was washing his car, just forgot he was working on a project.. Some days he asks his wife 3-4 times what is on the agenda. He is having confusional episodes. Very short-term memory loss. Progressing, worsening. He doesn't remember doing these things, he has no recollection of some of the things his wife is describing. He doesn't recall the events. He is making more notes than he used to.   He used to have syncopal episodes before his afib was controlled.He fell down at least a few times. That was related to arrythmia. The other day he felt dizzy but otherwise no more loss of consciousness. He  feels the depression is much better over the last 2 years. His remote memory is intact. He has 3 daughters who have noticed. Father had Alzheimers. Denies hallucinations, delusions, changes in personality, tremor, compulsive behavior, REM sleep disorder, parkinsonism, impulsivity and impaired judgement.    Reviewed notes, labs and imaging from outside physicians, which showed: MRI of the brain showed no acute intracranial abnormalities including mass lesion or mass effect, hydrocephalus, extra-axial fluid collection, midline shift, hemorrhage, or acute infarction, large ischemic events (personally reviewed images). TSH, B!2, CMP, CBC, folate, HIV, RPR normal.      Review of Systems: Patient complains of symptoms per HPI as well as the following symptoms: memory loss, depression, palpitations, allergies. Pertinent negatives per HPI. All others negative.   History   Social History  . Marital Status: Married    Spouse Name: Tammy    Number of Children: 3  . Years of Education: College+   Occupational History  . Director of pediatric intensive care [employer]   Social History Main Topics  . Smoking status: Never Smoker   . Smokeless tobacco: Not on file  . Alcohol Use: 0.0 oz/week    0 Not specified per week     Comment: daily use  . Drug Use: No  . Sexual Activity: Not on file   Other Topics Concern  . 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   Remarried - wife works at [employer] - Bilateral mastectomy for cancer in 2011   Has three daughters born in mid- late 86s - 2 at [employer] in [place] in 2012   Exwife in [place]   Caffeine use: 2 cups per day    Family History  Problem Relation Age of Onset  . Prostate cancer Father 32    Died of prostate cancer  . Atrial fibrillation Father     normal coronaries  . Hypertension Father     and brother    Past Medical History  Diagnosis Date  . Phlebitis   . Hypertension   .  Depression     Past Surgical History  Procedure Laterality Date  . Appendectomy  1988    Current Outpatient Prescriptions  Medication Sig Dispense Refill  . atorvastatin (LIPITOR) 40 MG tablet Take 1 tablet (40 mg total) by mouth daily. 90 tablet 3  . buPROPion (WELLBUTRIN XL) 300 MG 24 hr tablet TAKE 1 TABLET BY MOUTH DAILY 90 tablet 3  . desvenlafaxine (PRISTIQ) 50 MG 24 hr tablet Take 1 tablet (50 mg total) by mouth daily. 90 tablet 3  . diltiazem (CARDIZEM) 30 MG tablet Take 30 mg by mouth as needed.    . hydrochlorothiazide (HYDRODIURIL) 25 MG tablet Take 1 tablet (25 mg total) by mouth daily. Every morning 90 tablet 3  . lamoTRIgine (LAMICTAL) 200 MG tablet TAKE 1 TABLET BY MOUTH ONCE DAILY 90 tablet 0  . lisinopril (PRINIVIL,ZESTRIL) 10 MG tablet Take 1 tablet (10 mg total) by mouth daily. 90 tablet 3  . metoprolol succinate (TOPROL-XL) 25 MG 24 hr tablet Take 1 tablet (25 mg total) by mouth daily. 90 tablet 3   No current facility-administered medications for this visit.    Allergies as of 05/06/2014  . (No Known Allergies)    Vitals: BP 130/82 mmHg  Pulse 73  Ht 6' (1.829 m)  Wt 215 lb (97.523 kg)  BMI 29.15 kg/m2 Last Weight:  Wt Readings from Last 1 Encounters:  05/06/14 215 lb (97.523 kg)   Last Height:   Ht Readings from Last 1 Encounters:  05/06/14 6' (1.829 m)   Physical exam: Exam: Gen: NAD, conversant, well nourised, well groomed                     CV: Afib. No peripheral edema, warm, nontender Eyes: Conjunctivae clear without exudates or hemorrhage  Neuro: Detailed Neurologic Exam  Speech:    Speech is normal; fluent and spontaneous with normal comprehension.  Cognition: MoCA 26/30 (-1 cube drawing, -1 serial seven, -2 delayed recall)    The patient is oriented to person, place, and time;     recent and remote memory intact;     language fluent;     normal attention, concentration,     fund of knowledge Cranial Nerves:    The pupils are  equal, round, and reactive to light. The fundi are normal and spontaneous venous pulsations are present. Visual fields are full to finger confrontation. Extraocular movements are intact. Trigeminal sensation is intact and the muscles of mastication are normal. The face is symmetric. The palate elevates in the midline. Hearing intact. Voice is normal. Shoulder shrug is normal. The tongue has normal motion without fasciculations.   Coordination:    No dysmetria Gait:    Normal native gait  Motor Observation:    No asymmetry, no atrophy, and no involuntary movements noted. Tone:  Normal muscle tone.    Posture:    Posture is normal. normal erect    Strength:    Strength is V/V in the upper and lower limbs.      Sensation: intact to LT     Reflex Exam:  DTR's: Absent achilles bilaterally.Otherwise deep tendon reflexes in the upper and lower extremities are normal bilaterally.   Toes:    The toes are downgoing bilaterally.   Clonus:    Clonus is absent.  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 MoCA is 26/30 (normal) today.Patient becomes confused when performing a series of tasks and in some cases completely forgets what he is doing and wanders away or doesn't remember incidents at all. He forgets how to perform tasks like toast bread.  His work does not seem to be affected, on;y at home, Have suggested he confide in a close colleague and ask if they have noticed similar incidents. Could this be complex partial seizures? If insurance will approve,I think an FDG-PET scan would be quite informational; FDG- PET scan is very sensitive for Alzheimer's type dementia and can show abnormalities even before symptoms are clinically measurable. Will order a routine EEG. Could consider extended home video EEG as well. Neuropsychological testing with Dr. Eula Flax would be most  insightful as his analyses are exceptionally thorough. Can't rule out depression or medication effects. B12 and TSH were already evaluated, will also screen for some less common causes of encephalopathy such as  Paraneoplastic and autoimmune causes of encephalitis/encephalopathy.   Naomie Dean, MD  [facility] [address] [place], [place] [address]  Phone [phone] Fax [phone]

## 2014-05-07 ENCOUNTER — Telehealth: Payer: Self-pay | Admitting: Neurology

## 2014-05-07 NOTE — Telephone Encounter (Signed)
Spoke to Dr. Raymon MuttonUhl, gave results. Patient verbalized understanding.

## 2014-05-07 NOTE — Telephone Encounter (Signed)
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 Thank you

## 2014-05-10 NOTE — Addendum Note (Signed)
Addended by: Naomie DeanAHERN, Posey Petrik B on: 05/10/2014 11:59 AM   Modules accepted: Orders

## 2014-05-11 ENCOUNTER — Ambulatory Visit (HOSPITAL_COMMUNITY): Payer: 59

## 2014-05-11 ENCOUNTER — Other Ambulatory Visit (HOSPITAL_COMMUNITY): Payer: Self-pay

## 2014-05-11 LAB — SEDIMENTATION RATE: SED RATE: 4 mm/h (ref 0–30)

## 2014-05-11 LAB — HEAVY METALS, BLOOD
Arsenic: 7 ug/L (ref 2–23)
Lead, Blood: NOT DETECTED ug/dL (ref 0–19)
Mercury: 2.8 ug/L (ref 0.0–14.9)

## 2014-05-11 LAB — AMMONIA: Ammonia: 52 ug/dL (ref 27–102)

## 2014-05-11 LAB — PARANEOPLASTIC PROFILE 1: Neuronal Nuclear (Hu) Antibody (IB): <1:10 {titer}

## 2014-05-11 LAB — PAN-ANCA: P-ANCA: <1:20 {titer}

## 2014-05-11 LAB — ANA W/REFLEX: ANA: NEGATIVE

## 2014-05-11 LAB — C-REACTIVE PROTEIN: CRP: 0.8 mg/L (ref 0.0–4.9)

## 2014-05-11 LAB — VITAMIN B1, WHOLE BLOOD: THIAMINE: 138.8 nmol/L (ref 66.5–200.0)

## 2014-05-21 ENCOUNTER — Ambulatory Visit (HOSPITAL_COMMUNITY): Payer: 59

## 2014-05-26 ENCOUNTER — Ambulatory Visit: Payer: 59 | Attending: Psychology | Admitting: Psychology

## 2014-05-26 DIAGNOSIS — R413 Other amnesia: Secondary | ICD-10-CM

## 2014-05-26 NOTE — Progress Notes (Addendum)
Initial Contact Note  Name: Edgar Maxwell [place] MRN: [id number] Date: 05/26/2014  Edgar Maxwell [place] is an 64 y.o. right handed male who was referred for neuropsychological evaluation by Naomie DeanAntonia Ahern, MD due to problems with memory.   A total of 6 hours was spent today reviewing medical records, interviewing (CPT [id number]) Edgar Maxwell [place] and administering and scoring neurocognitive tests (CPT 96118/96119/96120).  There were no concerns expressed or behaviors displayed by Edgar Maxwell [place] that would require immediate attention.   A full report will follow once the planned testing has been completed. His next appointment is scheduled for 06/05/14.    Gladstone PihMichael F. Keirsten Matuska, Ph.D Licensed Psychologist 05/26/2014

## 2014-05-28 ENCOUNTER — Ambulatory Visit (HOSPITAL_COMMUNITY)
Admission: RE | Admit: 2014-05-28 | Discharge: 2014-05-28 | Disposition: A | Discharge status: Home / Self Care | Payer: 59 | Source: Ambulatory Visit | Attending: Neurology | Admitting: Neurology

## 2014-05-28 ENCOUNTER — Encounter (HOSPITAL_COMMUNITY): Payer: Self-pay

## 2014-05-28 DIAGNOSIS — F0391 Unspecified dementia with behavioral disturbance: Secondary | ICD-10-CM | POA: Insufficient documentation

## 2014-05-28 DIAGNOSIS — F03918 Unspecified dementia, unspecified severity, with other behavioral disturbance: Secondary | ICD-10-CM

## 2014-05-28 MED ORDER — FLUDEOXYGLUCOSE F - 18 (FDG) INJECTION
9.7000 | Freq: Once | INTRAVENOUS | Status: AC | PRN
  Administered 2014-05-28: 9.7 via INTRAVENOUS

## 2014-06-01 ENCOUNTER — Telehealth: Payer: Self-pay | Admitting: Neurology

## 2014-06-01 NOTE — Telephone Encounter (Signed)
Discussed results with patient and recommendation for beta amyloid brain PET-CT scan. Patient with discuss with wife and get back to us.

## 2014-06-05 ENCOUNTER — Ambulatory Visit: Payer: 59 | Attending: Psychology | Admitting: Psychology

## 2014-06-05 ENCOUNTER — Other Ambulatory Visit: Payer: Self-pay | Admitting: Neurology

## 2014-06-05 DIAGNOSIS — R413 Other amnesia: Secondary | ICD-10-CM

## 2014-06-05 MED ORDER — DONEPEZIL HCL 5 MG PO TABS: 5.0000 mg | ORAL_TABLET | Freq: Every day | ORAL | Status: DC

## 2014-06-05 NOTE — Progress Notes (Addendum)
[name], Ph.D [facility] ___________________________________________________________________________ [address]                                                                           Telephone [phone] [address]                                                                                                 Fax [phone]) [phone] [place], [place] [address]   NEUROPSYCHOLOGICAL EVALUATION  *CONFIDENTIAL* This report should not be released without the consent of the client  Name:   Edgar Maxwell Date of Birth:  January 07, 2051 [facility] MR#:  [id number] Dates of Evaluation: 05/26/14 & 06/05/14  Reason for Referral Edgar Maxwell is a 64 year-old, right-handed man who was referred for neuropsychological evaluation by Naomie DeanAntonia Ahern, MD of [facility]. 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 A brain MRI scan on 05/01/14 was normal although a NM PET metabolic brain scan on 05/29/14 showed "very subtle hypometabolism within the parietal lobes and temporal lobes compared to the frontal lobes. which is a pattern that can be seen in Alzheimer's type dementia".  Sources of Information Electronic medical records from  [facility] were [facility]. Dr. Raymon MuttonUhl and his wife, Ms. Santiago Bumpersammy Maffia, were interviewed.    Chief Complaints  Dr. Raymon MuttonUhl reported that around two years ago he began to become more easily distracted, prone to lose his train of thought, less able to perform multi-step tasks and inconsistently remember to perform a task as planned. These problems have become more frequent over time. He has noticed these cognitive difficulties only while at home. 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 The only  change in his health status around the time of onset of his cognitive difficulties was being diagnosed with paroxysmal fibrillation. He did not report any concurrent changes in his mood, level of life stress or social situation. He currently reports being able to perform his basic and instrumental activities of daily living without difficulty. He denied problems with gait, coordination, vision, hearing, sleep, daytime wakefulness, speech, swallowing or appetite. He described himself as usually in good spirits. He reported a history of recurrent depressive episodes tied to the seasons though stated that his mood has been consistently decent over the past two years. He denied experiencing apathy, racing thoughts, mood swings, compulsive behavior, problems with impulse control, suicidal or homicidal ideation, auditory or visual hallucinations, or delusional ideas.  According to his wife, he has displayed a gradual decline in his cognitive functioning over the past eighteen months ago.  At first, she noticed  that he was repeatedly asking the same question within a few minutes. A few months later she noticed that he was struggling to find words while speaking and was uncharacteristically forgetting to perform tasks or activities as planned. He did not recall some recent events. She has not observed any changes in his mood or personality. He has appeared in good spirits and has maintained his interests in social and recreational activities. He has not displayed signs of mood instability, apathy, aggression, paranoia, problems with impulse control, inappropriate social behavior or unsafe behavior.   Background Information Social Background Dr. Raymon Mutton lives with his wife of eight years, Ms. Tammy Radovich. He has three daughters, all in their twenties, from his first marriage, which ended in divorce.  Developmental History He reported no history of developmental delays or unusual childhood illness.   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 He has been employed by [employer] since 1983. He has a medical degree. He reported no history of school-based attentional or learning problems.   Medical History His past medical history was notable for hyperlipidemia, hypertension and paroxysmal fibrillation (diagnosed in 2014). He had a history of syncopal episodes prior to his afib being treated. He reported no history of head injury, stroke-like symptoms, seizures, neurological infections or exposure to neurotoxic chemicals.   Substance Use He reported that he has never used illicit drugs or tobacco products. He reported consuming alcohol in social contexts and not to intoxication.  Mental Health History He reported a history of recurrent depressive episodes that tend to last about two months. These depressive episodes have most often occurred in the wintertime. He reported an initial depressive episode while going thru a contentious divorce in the 80s. He reported that he has always been able to maintain his daily and work functioning when depressed. According to his wife, he was prescribed lamotrigine a few years ago due to flashes of intense irritability while depressed. He reported that his psychiatrist, Dr. Donell Beers, added bupropion almost three years ago.  He reported no prior history of mood instability, suicidal behavior or psychotic symptoms.   Current Medications His current medications include atorvastatin, bupropion, desvenlafaxine, diltiazem, hydrochlorothiazide, lamotrigine, lisinopril and metoprolol succinate.  Family Medical History He reported that his father had exhibited memory loss in his early sixties and eventually developed dementia suspected to be of the Alzheimer's type.   Legal He reported no history of legal charges, personal injury litigation, workers Water engineer or application for disability.    Evaluation Procedures In addition to a review of medical records and clinical interviews, the following tests and questionnaires were administered: Animal Naming Test Beck Anxiety Inventory Beck Depression Inventory-II [place] Naming Test Controlled Oral Word Association Test  Rey Complex Figure- copy [employer] Figural Fluency Test Short Category Test Stroop Color Word Test  Trail Making A & B   Wechsler Adult Intelligence Scale- IV Wechsler Memory Scale-IV Wisconsin Card Sorting Test  Observations & Interpretative Considerations He appeared as an appropriately dressed and groomed man in no apparent physical distress. He interacted in a consistently pleasant and cooperative manner. There were no signs of unusual mannerisms or motor activity. He was able to communicate his ideas without difficulty. His affect appeared within a wide range and was well-modulated. He did not show signs of emotional distress. His thought processes were coherent and organized without loose associations, verbal perseverations or flight of ideas. His thought content was devoid  of unusual or bizarre ideas.  It was concluded that the test results represented a valid measure of his cognitive functioning. He did not display signs of physical or emotional distress. He did not report or display problems with vision (he wore his eyeglasses), hearing or motor control. He appeared to maintain alertness and persist to task. There were no signs of careless or perseverative responding. He appeared to exert maximal effort.   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   His test scores were corrected to reflect norms for his age and, whenever possible, his gender and educational level (i.e., 20+ years). Please see a listing of test scores at the end of this report.  Test Results His general level of cognitive ability, as  assessed on the Wechsler Adult Intelligence Scale-4th Edition (WAIS-IV), fell within the Average range as he scored a Full Scale IQ of 106, which corresponded to the 66th percentile. This result was deemed to reflect a decline based on his level of educational attainment and occupational background. Analysis of WAIS-IV Composite scores indicated that his relative strength, which was commensurate with pre-morbid expectations, was on a measure of his abilities to express his fund of acquired verbal knowledge and abstract verbal concepts (Verbal Comprehension: 93rd percentile). Measures of his abilities to perceive, organize and manipulate visual material (Perceptual Reasoning: 39th percentile), to sustain concentration and hold information temporarily in auditory memory for the purpose of information to perform a specific task (Working Memory: 30th percentile) and speed to process simple or routine visual information quickly and efficiently (Processing Speed: 70th percentile) all fell within the Average range. His individual subtest scores ranged from the Superior to Very Superior range on tests of word knowledge (Vocabulary) and verbal reasoning (Similarities) to the Low Average range on a test of nonverbal reasoning (Matrix Reasoning).   His speed of processing was variable. He performed within the Average range on tests that required the transcription of symbols to match digits using a key (WAIS-IV Coding) or analysis of sets of geometric symbols for similarities and differences (WAIS-IV Symbol Search). In contrast, his speed to scan an array of numbers and draw lines to connect them in numerical sequence (Trails A) was abnormally slow. Likewise, his speed to read words or name color hues (Stroop Test) was subnormal.   His ability to acquire and retain new information, as assessed on the Wechsler Memory Scale-IV (WMS-IV), was much lower than expected. His Immediate Memory Index (IMI), a measure of his ability  to recall verbal and visual information immediately after the stimuli is presented, fell within the Low Average range. His Delayed Memory Index (DMI), a measure of his ability to recall verbal and visual information after a 20 to 30 minute delay, fell at the lower boundary of the Low Average range. The discrepancy between his ability to learn and retain orally-presented information (WMS-IV Auditory Memory Index: 9th percentile) compared to a measure of his verbal intellectual aptitude (WAIS-IV Verbal Comprehension Index: 93rd percentile) was striking. His delayed auditory recall was as expected given his initial level of encoding, which suggested that his auditory memory was most constrained at the stage of initial acquisition. His ability to learn and retain visual information (WMS-IV Visual Memory Index: 23rd percentile) also fell within the Low Average range. Contrast scores between his immediate and delayed visual recall performances indicated that he retained an expected amount information related to spatial locations  though demonstrated abnormal forgetting of figural designs.  Executive functions, which were assessed through a variety of tasks that required mental flexibility, organization, reasoning and/or planning skills, were for the most part an area of weakness. His working memory span (i.e., ability to hold and manipulate information within short-term memory) was somewhat lower than expected as he scored between the Low Average to Average ranges on tests that required him to repeat spoken digits in forward, reverse or ascending sequence (WAIS-IV Digit Span), solve mental calculations (WAIS-IV Arithmetic), immediately recognize symbols in left to right order (WMS-IV) Symbol Span) or immediately recall spatial locations within a grid (WMS-IV Spatial Addition). He performed within the Average range on tests of cognitive flexibility that required him to keep track of an alternating and ascending  letter-number sequence (Trails B), name members of a category under time pressure (Animal Naming Test) or fluently generate unique designs within a set time period Microsoft Figural Fluency Test). His ability to fluently generate words to designated letters (Controlled Oral Word Association Test) was within the High Average range. His performance on a test that required selective attention and response inhibition in order to efficiently name the color a word was printed in rather than reading the word itself that did not match the color (Stroop Color Word Test) was below average, though in context of his slowed speeds to simply read words or name color hues, this result would be most indicative of generalized slowing rather than a loss of cognitive flexibility. His performances on test of abstract reasoning were highly discrepant as his ability to verbally classify ostensibly different objects or ideas to a shared category (WAIS-IV Similarities) fell within the Very Superior range but his performance on a test that required matching designs or symbols in an abstract manner (WAIS-IV Matrix Reasoning) fell within the Low Average range. Novel problem-solving was unequivocally impaired. On a test that required inferring abstract rules to sort geometric designs [employer]), he was able to quickly identify the initial sorting principle but thereafter could not deduce any other sorting principles despite ongoing trial-to-trial feedback. Moreover, more than half of his errors were of a perseverative nature, which suggested a tendency to rigidly stick with an incorrect or previously correct strategy. On another conceptual reasoning test, he made an above average number of errors when attempting to infer abstract hypotheses to group or categorize novel geometric shapes (Short Category Test). In addition, he was observed to not consistently maintain set as manifested by deviating from a correct strategy for no  apparent reason.    Qualitative observations of his language functioning did not suggest any problems for expression or oral comprehension. His ability to name to confrontation [facility]) was below expectations within the Low Average range. He was not able to name drawings of a compass, yoke, palette or abacus.  As noted above, his ability to fluently generate words to a designated letter under time pressure (Controlled Oral Word Association Test) was within the High Average range. His ability to define words (WAIS-IV Vocabulary) was within the Superior range.  Visual perceptual and visual-spatial functioning was normal. There were no signs of spatial inattention or problems with visual recognition. He did show abnormally slowed speed on a test of visual scanning (Trails A) though scanning difficulty was not evident on other tests that required visual search. He performed within normal expectations on tests of visuospatial organization that required  assembly of two-dimensional block designs from models Counsellor), mental reconstruction of fragmented  designs to match a whole Producer, television/film/video Puzzles) or drawing of a complex geometric design Forensic psychologist).  Perceptual-motor functions were not formally assessed. He demonstrated consistent right hand preference for writing and object manipulation. No problems with gross motor coordination were observed.  With regards to his emotional functioning, he reported a minimal level of current affective distress on standardized symptom questionnaires. His score of 6 on the Beck Depression Inventory-II was within the minimal or non-depressed range. No symptom was endorsed beyond 1 on a 0 - 3 scale. On the [facility], his score of 5 also fell within the normal or minimal range. Moreover, he noted that two of the three symptoms he had endorsed did not actually reflect anxiety.   Summary & Conclusions Dr. Derl Barrow has an  approximate eighteen month history of progressively worsening cognitive difficulties noticed by both himself and his wife. He has not exhibited any concurrent changes in mood or personality. He continues to work as a Development worker, community. He reported that his father had exhibited memory loss when in his early sixties and eventually developed dementia.  Neuropsychological evaluation identified multiple indications of cognitive decline. His most prominent area of deficit was conceptual reasoning and problem-solving. Dr. Raymon Mutton performed within the subnormal range on nonverbal tests that required inferring abstract rules, systematically applying logical reasoning and maintaining strategy in working memory. His general intellectual functioning fell within the Average range, which was interpreted as a decline from his pre-morbid level as estimated on the basis of his educational and occupational background. Only his verbal comprehension abilities, which fell within the Superior range, were commensurate with pre-morbid expectations. His memory functioning fell within the Low Average range, which represented a substantial decline from his pre-morbid level. Most striking, his ability to learn and retain orally-presented information fell at 9th percentile in comparison to his verbal intellectual aptitude within the Superior range. His auditory-verbal memory was most constrained at the stage of initial acquisition as his delayed verbal recall was as expected given his level of initial encoding. On the other hand, his ability to retain visual information after a delay was variable. Other abilities that were within the Low Average range and therefore reduced from pre-morbid level included response speed, naming to confrontation and nonverbal reasoning. He mostly performed within the Average range on tests of visual processing speed, visual working memory, cognitive flexibility and visual-spatial organization/construction. The only measured  cognitive skills that were clearly commensurate with pre-morbid expectations were his phonemic fluency, fund of word knowledge and verbal reasoning ability.  With regards to his psychological status, he reported a minimal level of affective distress at this time. Both he and his wife agreed that he has typically been in good spirits over the past two years. He reported a history of recurrent depressive episodes mostly occurring in the wintertime that have been under good control.  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 Emotional factors were not judged to be significantly interfering with his cognitive functioning. Neuropsychological test results would be in accordance with the results of his recent NM PET metabolic scan that was concerning for Alzheimer's pathology.  Recommendations 1. 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   2. He and his wife were advised to keep a close tab on his emotional status. His past history of recurrent depressive episodes coupled with the devastating finding of cognitive compromise places him at a high risk to become depressed. He was advised to consider seeking individual psychotherapy in the future as needed in addition to using psychiatric medications.   3. They were advised to read a book titled "Living with Mild Cognitive Impairment" by N. Anderson et al. for further information.  4. The current test results comprise a cognitive baseline. A repeat neuropsychological evaluation in one to two years (or sooner if clinically indicated) is recommended to track his  cognitive and emotional functioning.     We have appreciated the opportunity to evaluate Dr. Raymon Mutton. The results and recommendations from this evaluation were discussed with him and his wife on 06/05/14. Please feel free to contact me with any comments or questions.    ___________________ Gladstone Pih, Ph.D Licensed Psychologist      ADDENDUM-NEUROPSYCHOLOGICAL TEST RESULTS                      Name:   Con Arganbright. Hindley Date of Birth:  30-Dec-2050 [facility] MR#:  [id number] Dates of Evaluation: 05/26/14 & 06/05/14   Animal Naming Test Score= 19    34th (adjusted for age, gender and educational level)   [employer] Score= 56/60    24th (adjusted for age, gender and educational level)   Controlled Oral Word Association Test 58 words/2 repetitions  90th (adjusted for age, gender and educational level)   Rey Complex Figure: copy        Score= 33/36    normal   Ruff Figural Fluency Test Total Unique Designs= 82  48th (adjusted for age and educational level)   Short Category Test Errors=48       8th (92nd for error rate)   Stroop Color Word Test    Score   Residual      percentile (adjusted for age and educational level)  Word=    88   -25    4th          Color=     67       -14   11th                Color-Word=   33   -13   10th   Trails A       65s 0e       1st (adjusted for age, gender and educational level) Trails B           77s 1e    27th (adjusted for age, gender and educational level)   Wechsler Adult Intelligence Scale-IV    Scale       composite score   percentile rank              Verbal Comprehension=  122   93rd                   Perceptual Reasoning=    96    39th                                    Working Memory=     92   30th  Processing Speed=   108   70th                         Full Scale IQ=    106   66th                        Age-corrected scaled scores (mean= 10; st. dev.= 3):  Block Design  12     Similarities  16     Digit Span    9    Matrix Reasoning   7    Vocabulary  15  Arithmetic    8            Symbol Search 11 Visual Puzzles   9 Information  11   Coding   12      Wechsler Memory Scale-IV  Index                     Index Score       Percentile            Immediate Memory   84  14th                   Auditory Memory   80    9th                                  Visual Memory   89  23rd                             Delayed Memory   52    9th      Visual Working Memory  17  27th    [employer] Test    Total errors=       73  2nd  (adjusted for age and educational level)    Perseverative errors=  38  5th  Categories=             1           6th - 10th                    Trials to first category=    13  >16th                    Failure to maintain set=            0             >16th             Learning to Learn=              N/A

## 2014-06-16 ENCOUNTER — Encounter: Payer: Self-pay | Admitting: *Deleted

## 2014-06-19 DIAGNOSIS — Z0289 Encounter for other administrative examinations: Secondary | ICD-10-CM

## 2014-06-23 ENCOUNTER — Telehealth: Payer: Self-pay | Admitting: *Deleted

## 2014-06-23 ENCOUNTER — Telehealth: Payer: Self-pay | Admitting: Neurology

## 2014-06-23 NOTE — Telephone Encounter (Signed)
Patient's spouse requesting a return call @ [phone].

## 2014-06-23 NOTE — Telephone Encounter (Signed)
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 I told him we do and she will go ahead and complete them. I told him I would call him when they were ready to be picked up. Pt verbalized understanding.

## 2014-06-23 NOTE — Telephone Encounter (Signed)
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 Dr. Lucia GaskinsAhern also spoke with wife and told her she would call her later to start getting that filled out. Pt wife verbalized understanding.

## 2014-06-23 NOTE — Telephone Encounter (Signed)
Pt is returning your call, please call cell when returning call.

## 2014-06-23 NOTE — Telephone Encounter (Signed)
Left message for pt to call back. I was returning call from earlier.

## 2014-06-29 ENCOUNTER — Encounter: Payer: Self-pay | Admitting: Neurology

## 2014-06-29 ENCOUNTER — Ambulatory Visit (INDEPENDENT_AMBULATORY_CARE_PROVIDER_SITE_OTHER): Payer: 59 | Admitting: Neurology

## 2014-06-29 VITALS — BP 120/81 | HR 68 | Ht 72.0 in | Wt 215.5 lb

## 2014-06-29 DIAGNOSIS — F0280 Dementia in other diseases classified elsewhere without behavioral disturbance: Secondary | ICD-10-CM | POA: Diagnosis not present

## 2014-06-29 DIAGNOSIS — G300 Alzheimer's disease with early onset: Secondary | ICD-10-CM | POA: Diagnosis not present

## 2014-06-29 MED ORDER — DONEPEZIL HCL 10 MG PO TABS: 10.0000 mg | ORAL_TABLET | Freq: Every day | ORAL | Status: DC

## 2014-06-29 NOTE — Progress Notes (Addendum)
[facility]    Provider:  Dr Jaynee Eagles Referring Provider: Lind Covert, * Primary Care Physician:  Lind Covert, MD  CC:  Memory loss  HPI:  Edgar Maxwell is a 64 y.o. male here as a follow up for memory loss. He has been diagnosed with early onset neuro cognitive disorder, of the Alzheimer's type.   He has stopped working. Wife is here with patient. He was advised from Dr. 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 I advised patient and his wife that I also had a long conversation with Dr. Valentina Shaggy who performed extensive neurocognitive testing and agree with his findings and his interpretations. Symptoms noticed 5 years ago. 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 Furthermore these deficits are progressive. Since he is less than 65, this is considered early onset dementia. The PET scan completed showed hypometabolism in a pattern consistent with Alzheimers dementia. Had a long discussion with patient and his wife on this diagnosis.   Wife provides most information. He is having more momemts of confusion. His kitchen cabinets were being painted and patient was told things had to come out, patient put things in the spare bedrooms, he lifted the organizers out and dumped them in different corners. Silverware and utencils were in piles in the room instead of leaving them in the organizers. He remembers the event but he says he ran out of room - but wife says that didn't make sense because the organizers were on the bed and floor. They are seeing more periods of confusing. He is still driving, not getting lost. His mood is unchanged. They have a handful of friends and still  go out with them, but feels his sudden change in status has been difficult. He had a difficukt time setting up his automatic payments online for his new banking system, was very confusing to him and it took more time than it should have taken and deficintely more than in the past. He has to write lots of notes to himself, can't remember appointments and has to try and keep notes for reminders. Taking longer to do things he has done in the past because he gets confused especially if thee are a lot of steps involved or if the process has changed. Remote memory feels largely intact, more recent memory involved. More difficulty with execution of tasks especially if multi-steps such as cooking and paying bills and needs help.   Labs nml: ANA, ANCA, ESR, heavy metals, CBC, cmp, tsh, b12, folate, hiv, rpr,   MRI brain/MRA head:  IMPRESSION: 1. Normal for age MRI appearance of the brain. 2. 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 3. Moderate paranasal sinus inflammation  NM PET metabolic brain: IMPRESSION: Very subtle hypometabolism within the parietal lobes and temporal lobes compared to the frontal lobes. This pattern can be seen seen in Alzheimer's type dementia.  Initial Visit 05/06/2014: ISAURO Maxwell is a 64 y.o. male here as a referral from Dr. Erin Hearing for cognitive changes. He has a PMHx of afib (Dxed 1.5 years ago), depression, HLD . Wife accompanies and provides much of the information. She noticed symptoms 5 years ago which are progressive. He has a word and  he can't come up with it, word-finding doifficulties. Simple things are difficult for him. As far as he knows there is no impact at work at all. First noticed changes with remembering things. He will ask a question then 30 seconds later will ask the same question. No recollection he asked it the first time. He will ask the same things over and over. The last 6-8 months have been very  confused about the simplest things. For example, they were building a shelf and he left to find a tool, 20 minutes he had been searching for something and then he forgot what he was doing and started another task while the wife was still waiting for him to get the tool. The 2 most striking things most recently, he put the bread in the toaster pulled the bread out and couldn't remember how to get the toast. They were doing a project in the back yard and 15-20 minutes later he was washing his car, just forgot he was working on a project.. Some days he asks his wife 3-4 times what is on the agenda. He is having confusional episodes. Very short-term memory loss. Progressing, worsening. He doesn't remember doing these things, he has no recollection of some of the things his wife is describing. He doesn't recall the events. He is making more notes than he used to.   He used to have syncopal episodes before his afib was controlled.He fell down at least a few times. That was related to arrythmia. The other day he felt dizzy but otherwise no more loss of consciousness. He feels the depression is much better over the last 2 years. His remote memory is intact. He has 3 daughters who have noticed. Father had Alzheimers. Denies hallucinations, delusions, changes in personality, tremor, compulsive behavior, REM sleep disorder, parkinsonism, impulsivity and impaired judgement.    Reviewed notes, labs and imaging from outside physicians, which showed: MRI of the brain showed no acute intracranial abnormalities including mass lesion or mass effect, hydrocephalus, extra-axial fluid collection, midline shift, hemorrhage, or acute infarction, large ischemic events (personally reviewed images). TSH, B!2, CMP, CBC, folate, HIV, RPR normal.   Extensive Neurocognitive evaluation by Dr. Valentina Shaggy:  Test Results His general level of cognitive ability, as assessed on the Wechsler Adult Intelligence Scale-4th Edition (WAIS-IV), fell  within the Average range as he scored a Full Scale IQ of 106, which corresponded to the 66th percentile. This result was deemed to reflect a decline based on his level of educational attainment and occupational background. Analysis of WAIS-IV Composite scores indicated that his relative strength, which was commensurate with pre-morbid expectations, was on a measure of his abilities to express his fund of acquired verbal knowledge and abstract verbal concepts (Verbal Comprehension: 93rd percentile). Measures of his abilities to perceive, organize and manipulate visual material (Perceptual Reasoning: 39th percentile), to sustain concentration and hold information temporarily in auditory memory for the purpose of information to perform a specific task (Working Memory: 30th percentile) and speed to process simple or routine visual information quickly and efficiently (Processing Speed: 70th percentile) all fell within the Average range. His individual subtest scores ranged from the Superior to Very Superior range on tests of word knowledge (Vocabulary) and verbal reasoning (Similarities) to the Low Average range on a test of nonverbal reasoning (Matrix Reasoning).   His speed of processing was variable. He performed within the Average range on tests that required the transcription of symbols to match digits using a key (WAIS-IV Coding) or analysis of  sets of geometric symbols for similarities and differences (WAIS-IV Symbol Search). In contrast, his speed to scan an array of numbers and draw lines to connect them in numerical sequence (Trails A) was abnormally slow. Likewise, his speed to read words or name color hues (Stroop Test) was subnormal.   His ability to acquire and retain new information, as assessed on the Wechsler Memory Scale-IV (WMS-IV), was much lower than expected. His Immediate Memory Index (IMI), a measure of his ability to recall verbal and visual information immediately after the stimuli is  presented, fell within the Low Average range. His Delayed Memory Index (DMI), a measure of his ability to recall verbal and visual information after a 20 to 30 minute delay, fell at the lower boundary of the Low Average range. The discrepancy between his ability to learn and retain orally-presented information (WMS-IV Auditory Memory Index: 9th percentile) compared to a measure of his verbal intellectual aptitude (WAIS-IV Verbal Comprehension Index: 93rd percentile) was striking. His delayed auditory recall was as expected given his initial level of encoding, which suggested that his auditory memory was most constrained at the stage of initial acquisition. His ability to learn and retain visual information (WMS-IV Visual Memory Index: 23rd percentile) also fell within the Low Average range. Contrast scores between his immediate and delayed visual recall performances indicated that he retained an expected amount information related to spatial locations though demonstrated abnormal forgetting of figural designs.  Executive functions, which were assessed through a variety of tasks that required mental flexibility, organization, reasoning and/or planning skills, were for the most part an area of weakness. His working memory span (i.e., ability to hold and manipulate information within short-term memory) was somewhat lower than expected as he scored between the Low Average to Average ranges on tests that required him to repeat spoken digits in forward, reverse or ascending sequence (WAIS-IV Digit Span), solve mental calculations (WAIS-IV Arithmetic), immediately recognize symbols in left to right order (WMS-IV) Symbol Span) or immediately recall spatial locations within a grid (WMS-IV Spatial Addition). He performed within the Average range on tests of cognitive flexibility that required him to keep track of an alternating and ascending letter-number sequence (Trails B), name members of a category under time pressure  (Animal Naming Test) or fluently generate unique designs within a set time period [employer] Figural Fluency Test). His ability to fluently generate words to designated letters (Controlled Oral Word Association Test) was within the High Average range. His performance on a test that required selective attention and response inhibition in order to efficiently name the color a word was printed in rather than reading the word itself that did not match the color (Stroop Color Word Test) was below average, though in context of his slowed speeds to simply read words or name color hues, this result would be most indicative of generalized slowing rather than a loss of cognitive flexibility. His performances on test of abstract reasoning were highly discrepant as his ability to verbally classify ostensibly different objects or ideas to a shared category (WAIS-IV Similarities) fell within the Very Superior range but his performance on a test that required matching designs or symbols in an abstract manner (WAIS-IV Matrix Reasoning) fell within the Low Average range. Novel problem-solving was unequivocally impaired. On a test that required inferring abstract rules to sort geometric designs [employer]), he was able to quickly identify the initial sorting principle but thereafter could not deduce any other sorting principles despite ongoing trial-to-trial feedback. Moreover, more than  half of his errors were of a perseverative nature, which suggested a tendency to rigidly stick with an incorrect or previously correct strategy. On another conceptual reasoning test, he made an above average number of errors when attempting to infer abstract hypotheses to group or categorize novel geometric shapes (Short Category Test). In addition, he was observed to not consistently maintain set as manifested by deviating from a correct strategy for no apparent reason.    Qualitative observations of his language functioning did not  suggest any problems for expression or oral comprehension. His ability to name to confrontation [facility] [employer]) was below expectations within the Low Average range. He was not able to name drawings of a compass, yoke, palette or abacus.  As noted above, his ability to fluently generate words to a designated letter under time pressure (Controlled Oral Word Association Test) was within the High Average range. His ability to define words (WAIS-IV Vocabulary) was within the Superior range.  Visual perceptual and visual-spatial functioning was normal. There were no signs of spatial inattention or problems with visual recognition. He did show abnormally slowed speed on a test of visual scanning (Trails A) though scanning difficulty was not evident on other tests that required visual search. He performed within normal expectations on tests of visuospatial organization that required  assembly of two-dimensional block designs from models Product manager), mental reconstruction of fragmented designs to match a whole (WAIS-IV Visual Puzzles) or drawing of a complex geometric design Investment banker, corporate).  Perceptual-motor functions were not formally assessed. He demonstrated consistent right hand preference for writing and object manipulation. No problems with gross motor coordination were observed.  With regards to his emotional functioning, he reported a minimal level of current affective distress on standardized symptom questionnaires. His score of 6 on the Beck Depression Inventory-II was within the minimal or non-depressed range. No symptom was endorsed beyond 1 on a 0 - 3 scale. On the [facility], his score of 5 also fell within the normal or minimal range. Moreover, he noted that two of the three symptoms he had endorsed did not actually reflect anxiety.   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 He has not exhibited any concurrent changes in mood or personality. He continues to work as a Engineer, drilling. He reported that his father had exhibited memory loss when in his early 82 and eventually developed dementia.  Neuropsychological evaluation identified multiple indications of cognitive decline. His most prominent area of deficit was conceptual reasoning and problem-solving. 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 His general intellectual functioning fell within the Average range, which was interpreted as a decline from his pre-morbid level as estimated on the basis of his educational and occupational background. Only his verbal comprehension abilities, which fell within the Superior range, were commensurate with pre-morbid expectations. His memory functioning fell within the Low Average range, which represented a substantial decline from his pre-morbid level. Most striking, his ability to learn and retain orally-presented information fell at 9th percentile in comparison to his verbal intellectual aptitude within the Superior range. His auditory-verbal memory was most constrained at the stage of initial acquisition as his delayed verbal recall was as expected given his level of initial encoding. On the other hand, his ability to retain visual information after a  delay was variable. Other abilities that were within the Low Average range and therefore reduced from pre-morbid level included response speed, naming to confrontation and nonverbal reasoning. He mostly performed within the Average range on tests of visual processing speed, visual working memory, cognitive flexibility and visual-spatial organization/construction. The only measured cognitive skills that were clearly commensurate with pre-morbid expectations were his  phonemic fluency, fund of word knowledge and verbal reasoning ability.  With regards to his psychological status, he reported a minimal level of affective distress at this time. Both he and his wife agreed that he has typically been in good spirits over the past two years. He reported a history of recurrent depressive episodes mostly occurring in the wintertime that have been under good control.  Review of Systems: Patient complains of symptoms per HPI as well as the following symptoms: No CP, no Fever, No SOB, no rash, no recent illnesses. Pertinent negatives per HPI. All others negative.   History   Social History  . Marital Status: Married    Spouse Name: Tammy  . Number of Children: 3  . Years of Education: College+   Occupational History  . Director of pediatric intensive care [employer]   Social History Main Topics  . Smoking status: Never Smoker   . Smokeless tobacco: Not on file  . Alcohol Use: 0.0 oz/week    0 Standard drinks or equivalent per week     Comment: daily use  . Drug Use: No  . Sexual Activity: Not on file   Other Topics Concern  . 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   Remarried - wife works at [employer] - Bilateral mastectomy for cancer in 2011   Has three daughters born in mid- late 68s - 2 at [employer] in [place] in 2012   Exwife in [place]   Caffeine use: 2 cups per day    Family History  Problem Relation Age of Onset  . Prostate cancer Father 49    Died of prostate cancer  . Atrial fibrillation Father     normal coronaries  . Hypertension Father     and brother    Past Medical History  Diagnosis Date  . Phlebitis   . Hypertension   . Depression     Past Surgical History  Procedure Laterality Date  . Appendectomy  1988    Current Outpatient Prescriptions  Medication Sig Dispense Refill  . atorvastatin (LIPITOR) 40 MG tablet Take 1 tablet (40 mg total) by mouth daily. 90 tablet 3  .  buPROPion (WELLBUTRIN XL) 300 MG 24 hr tablet TAKE 1 TABLET BY MOUTH DAILY 90 tablet 3  . desvenlafaxine (PRISTIQ) 50 MG 24 hr tablet Take 1 tablet (50 mg total) by mouth daily. 90 tablet 3  . diltiazem (CARDIZEM) 30 MG tablet Take 30 mg by mouth as needed.    . donepezil (ARICEPT) 5 MG tablet Take 1 tablet (5 mg total) by mouth at bedtime. 30 tablet 3  . hydrochlorothiazide (HYDRODIURIL) 25 MG tablet Take 1 tablet (25 mg total) by mouth daily. Every morning 90 tablet 3  . lamoTRIgine (LAMICTAL) 200 MG tablet TAKE 1 TABLET BY MOUTH ONCE DAILY 90 tablet 0  . lisinopril (PRINIVIL,ZESTRIL) 10 MG tablet Take 1 tablet (10 mg total) by mouth daily. 90 tablet 3  . metoprolol succinate (TOPROL-XL) 25 MG 24 hr tablet Take 1 tablet (25 mg total) by mouth daily. 90 tablet 3  No current facility-administered medications for this visit.    Allergies as of 06/29/2014  . (No Known Allergies)    Vitals: BP 120/81 mmHg  Pulse 68  Ht 6' (1.829 m)  Wt 215 lb 8 oz (97.75 kg)  BMI 29.22 kg/m2 Last Weight:  Wt Readings from Last 1 Encounters:  06/29/14 215 lb 8 oz (97.75 kg)   Last Height:   Ht Readings from Last 1 Encounters:  06/29/14 6' (1.829 m)    Physical exam: Exam: Gen: NAD, conversant, well nourised, well groomed                     CV: RRR, no MRG. No Carotid Bruits. No peripheral edema, warm, nontender Eyes: Conjunctivae clear without exudates or hemorrhage  Speech:    Speech is normal; fluent and spontaneous with normal comprehension.  Cognition:    The patient is oriented to person, place.    Cranial Nerves:    The pupils are equal, round, and reactive to light. The fundi are normal and spontaneous venous pulsations are present. Visual fields are full to finger confrontation. Extraocular movements are intact. Trigeminal sensation is intact and the muscles of mastication are normal. The face is symmetric. The palate elevates in the midline. Hearing intact. Voice is normal. Shoulder  shrug is normal. The tongue has normal motion without fasciculations.   Motor Observation:    No asymmetry, no atrophy, and no involuntary movements noted. Tone:    Normal muscle tone.    Posture:    Posture is normal. normal erect    Strength:    Strength is V/V in the upper and lower limbs.        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 Neurocognitive testing does reveal declined cognitive ability based on his level of education and occupation, poor ability to acquire and retain new information, weak executive functioning. Agree with Dr. 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  Furthermore these deficits are progressive. Since he is less than 62, this is considered early onset neuro cognitive disorder. The PET scan completed showed hypometabolism in a patterm consistent with Alzheimers dementia. Had a long discussion with patient and his wife on this diagnosis. Recommend that patient relinquish his driver's license.   Addendum 07/17/2014: Spoke to wife. Patient is progressing, more difficulty finding words. He called wife one day and he asked if she could come home early because the yards guys were there and he didn't understand how much he had paid, how much more to give them, how to finalize and pay the bill, write a check. Can't cook for himself, can't figure out how to use the toaster oven. His wife would be worried to let him near the oven. His insight and judgement is progressively getting poorer, took a knife to bed because he heard an intruder and didn't realize it may have better to call 911 or wake his wife. Poor insight and judgement. I have recommended that he stops driving giving his decline  in cognitive abilites, judgement and insight.   Sarina Ill, MD  [facility] [address] [place], [place] [address]  Phone [phone] Fax [phone]  A total of 45 minutes was spent face-to-face with this patient.  Over half this time was spent on counseling patient on the dementia diagnosis and different diagnostic and therapeutic options available.

## 2014-06-29 NOTE — Patient Instructions (Signed)
Overall you are doing fairly well but I do want to suggest a few things today:   Remember to drink plenty of fluid, eat healthy meals and do not skip any meals. Try to eat protein with a every meal and eat a healthy snack such as fruit or nuts in between meals. Try to keep a regular sleep-wake schedule and try to exercise daily, particularly in the form of walking, 20-30 minutes a day, if you can.   As far as your medications are concerned, I would like to suggest: Aricept 10mg  daily  I would like to see you back in 3-4 months, sooner if we need to. Please call us with any interim questions, concerns, problems, updates or refill requests.   Please also call us for any test results so we can go over those with you on the phone.  My clinical assistant and will answer any of your questions and relay your messages to me and also relay most of my messages to you.   Our phone number is [phone]. We also have an after hours call service for urgent matters and there is a physician on-call for urgent questions. For any emergencies you know to call 911 or go to the nearest emergency room

## 2014-07-07 ENCOUNTER — Other Ambulatory Visit: Payer: Self-pay | Admitting: Family Medicine

## 2014-07-12 DIAGNOSIS — F0280 Dementia in other diseases classified elsewhere without behavioral disturbance: Secondary | ICD-10-CM | POA: Insufficient documentation

## 2014-07-12 DIAGNOSIS — G300 Alzheimer's disease with early onset: Principal | ICD-10-CM

## 2014-07-17 ENCOUNTER — Telehealth: Payer: Self-pay | Admitting: Neurology

## 2014-07-17 NOTE — Telephone Encounter (Signed)
Spoke to wife. Patient is progressing, more difficulty finding words. He called wife one day and he asked if she could come home early because the yards guys were there and he didn't understand how much he had paid, how much more to give them, how to finalize and pay the bill. Can't cook for himself, can't figure out how to use the toaster oven. His wife would be worried to let him near the oven. His insight is poor, took a knife to bed because he heard an intruder and didn't realize it may have better to call 911 or wake his wife. Poor insight and judgement. I have recommended that he stops driving giving his decline in cognitive abilites, judgement and insight.

## 2014-08-12 ENCOUNTER — Encounter: Payer: Self-pay | Admitting: Internal Medicine

## 2014-08-13 ENCOUNTER — Other Ambulatory Visit: Payer: Self-pay | Admitting: Family Medicine

## 2014-08-17 ENCOUNTER — Telehealth: Payer: Self-pay | Admitting: *Deleted

## 2014-08-17 ENCOUNTER — Encounter: Payer: Self-pay | Admitting: *Deleted

## 2014-08-17 NOTE — Telephone Encounter (Signed)
[facility]

## 2014-08-17 NOTE — Patient Instructions (Signed)
Gave completed [employer] paperwork for pt to Medical records on 08/17/14.

## 2014-08-19 ENCOUNTER — Encounter: Payer: Self-pay | Admitting: Internal Medicine

## 2014-09-14 ENCOUNTER — Ambulatory Visit (INDEPENDENT_AMBULATORY_CARE_PROVIDER_SITE_OTHER): Payer: 59 | Admitting: Neurology

## 2014-09-14 ENCOUNTER — Encounter: Payer: Self-pay | Admitting: Neurology

## 2014-09-14 ENCOUNTER — Other Ambulatory Visit: Payer: Self-pay | Admitting: Family Medicine

## 2014-09-14 VITALS — BP 107/71 | HR 69 | Temp 98.4°F | Ht 72.0 in | Wt 216.0 lb

## 2014-09-14 DIAGNOSIS — F99 Mental disorder, not otherwise specified: Secondary | ICD-10-CM | POA: Diagnosis not present

## 2014-09-14 DIAGNOSIS — F0280 Dementia in other diseases classified elsewhere without behavioral disturbance: Secondary | ICD-10-CM

## 2014-09-14 DIAGNOSIS — G300 Alzheimer's disease with early onset: Secondary | ICD-10-CM | POA: Diagnosis not present

## 2014-09-14 DIAGNOSIS — F0390 Unspecified dementia without behavioral disturbance: Secondary | ICD-10-CM

## 2014-09-14 DIAGNOSIS — F0150 Vascular dementia without behavioral disturbance: Secondary | ICD-10-CM

## 2014-09-14 MED ORDER — DONEPEZIL HCL 10 MG PO TABS: 10.0000 mg | ORAL_TABLET | Freq: Every day | ORAL | Status: AC

## 2014-09-14 NOTE — Progress Notes (Addendum)
[facility]    Provider:  Dr Jaynee Eagles Referring Provider: Lind Covert, * Primary Care Physician:  Lind Covert, MD  CC:  Early onset dementia  HPI:  Edgar Maxwell is a 64 y.o. male here as a referral from Dr. Erin Hearing for early onset alzheimer's dementia. He has severe cognitive deficits as compared to his previous high functioning level. These deficits have caused impairment of independence. Decision making continues to deteriorate with increased poor judgement. He is confused more often with uncomplicated tasks. His wife manages the bills now, because it is difficult for him to write checks; he is having difficulty even setting up auto payments. He needs to take notes on everything. He has retired from his job as an attending at [employer] because his impairments made it unsafe for him to perform the duties of the job he trained for over 10 years to learn and then performed for another 20+ years. This has caused significant loss to patient.   Previous visit 06/29/2014: Edgar Maxwell is a 64 y.o. male here as a follow up for memory loss. He has been diagnosed with early onset neuro cognitive disorder, of the Alzheimer's type.   He has stopped working. Wife is here with patient. He was advised from Dr. 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 I advised patient and his wife that I also had a long conversation with Dr. Valentina Shaggy who performed extensive neurocognitive testing and agree with his findings and his interpretations. Symptoms noticed 5 years ago. 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 Furthermore these deficits are progressive. Since he is less than 65, this  is considered early onset dementia. The PET scan completed showed hypometabolism in a pattern consistent with Alzheimers dementia. Had a long discussion with patient and his wife on this diagnosis.   Wife provides most information. He is having more momemts of confusion. His kitchen cabinets were being painted and patient was told things had to come out, patient put things in the spare bedrooms, he lifted the organizers out and dumped them in different corners. Silverware and utencils were in piles in the room instead of leaving them in the organizers. He remembers the event but he says he ran out of room - but wife says that didn't make sense because the organizers were on the bed and floor. They are seeing more periods of confusing. He is still driving, not getting lost. His mood is unchanged. They have a handful of friends and still go out with them, but feels his sudden change in status has been difficult. He had a difficukt time setting up his automatic payments online for his new banking system, was very confusing to him and it took more time than it should have taken and deficintely more than in the past. He has to write lots of notes to himself, can't remember appointments and has to try and keep notes for reminders. Taking longer to do things he has done in the past because he gets confused especially if thee are a lot of steps involved or if the process has changed. Remote memory feels largely intact, more recent memory involved. More difficulty with execution of tasks especially if multi-steps such as cooking and  paying bills and needs help.   Labs nml: ANA, ANCA, ESR, heavy metals, CBC, cmp, tsh, b12, folate, hiv, rpr,   MRI brain/MRA head:  IMPRESSION: 1. Normal for age MRI appearance of the brain. 2. 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 3. Moderate paranasal sinus inflammation  NM PET metabolic brain: IMPRESSION: Very subtle  hypometabolism within the parietal lobes and temporal lobes compared to the frontal lobes. This pattern can be seen seen in Alzheimer's type dementia.  Initial Visit 05/06/2014: Edgar Maxwell is a 64 y.o. male here as a referral from Dr. Erin Hearing for cognitive changes. He has a PMHx of afib (Dxed 1.5 years ago), depression, HLD . Wife accompanies and provides much of the information. She noticed symptoms 5 years ago which are progressive. He has a word and he can't come up with it, word-finding doifficulties. Simple things are difficult for him. As far as he knows there is no impact at work at all. First noticed changes with remembering things. He will ask a question then 30 seconds later will ask the same question. No recollection he asked it the first time. He will ask the same things over and over. The last 6-8 months have been very confused about the simplest things. For example, they were building a shelf and he left to find a tool, 20 minutes he had been searching for something and then he forgot what he was doing and started another task while the wife was still waiting for him to get the tool. The 2 most striking things most recently, he put the bread in the toaster pulled the bread out and couldn't remember how to get the toast. They were doing a project in the back yard and 15-20 minutes later he was washing his car, just forgot he was working on a project.. Some days he asks his wife 3-4 times what is on the agenda. He is having confusional episodes. Very short-term memory loss. Progressing, worsening. He doesn't remember doing these things, he has no recollection of some of the things his wife is describing. He doesn't recall the events. He is making more notes than he used to.   He used to have syncopal episodes before his afib was controlled.He fell down at least a few times. That was related to arrythmia. The other day he felt dizzy but otherwise no more loss of consciousness. He feels the  depression is much better over the last 2 years. His remote memory is intact. He has 3 daughters who have noticed. Father had Alzheimers. Denies hallucinations, delusions, changes in personality, tremor, compulsive behavior, REM sleep disorder, parkinsonism, impulsivity and impaired judgement.    Reviewed notes, labs and imaging from outside physicians, which showed: MRI of the brain showed no acute intracranial abnormalities including mass lesion or mass effect, hydrocephalus, extra-axial fluid collection, midline shift, hemorrhage, or acute infarction, large ischemic events (personally reviewed images). TSH, B!2, CMP, CBC, folate, HIV, RPR normal.   Extensive Neurocognitive evaluation by Dr. Valentina Shaggy:  Test Results His general level of cognitive ability, as assessed on the Wechsler Adult Intelligence Scale-4th Edition (WAIS-IV), fell within the Average range as he scored a Full Scale IQ of 106, which corresponded to the 66th percentile. This result was deemed to reflect a decline based on his level of educational attainment and occupational background. Analysis of WAIS-IV Composite scores indicated that his relative strength, which was commensurate with pre-morbid expectations, was on a measure of his abilities to express  his fund of acquired verbal knowledge and abstract verbal concepts (Verbal Comprehension: 93rd percentile). Measures of his abilities to perceive, organize and manipulate visual material (Perceptual Reasoning: 39th percentile), to sustain concentration and hold information temporarily in auditory memory for the purpose of information to perform a specific task (Working Memory: 30th percentile) and speed to process simple or routine visual information quickly and efficiently (Processing Speed: 70th percentile) all fell within the Average range. His individual subtest scores ranged from the Superior to Very Superior range on tests of word knowledge (Vocabulary) and verbal reasoning  (Similarities) to the Low Average range on a test of nonverbal reasoning (Matrix Reasoning).   His speed of processing was variable. He performed within the Average range on tests that required the transcription of symbols to match digits using a key (WAIS-IV Coding) or analysis of sets of geometric symbols for similarities and differences (WAIS-IV Symbol Search). In contrast, his speed to scan an array of numbers and draw lines to connect them in numerical sequence (Trails A) was abnormally slow. Likewise, his speed to read words or name color hues (Stroop Test) was subnormal.   His ability to acquire and retain new information, as assessed on the Wechsler Memory Scale-IV (WMS-IV), was much lower than expected. His Immediate Memory Index (IMI), a measure of his ability to recall verbal and visual information immediately after the stimuli is presented, fell within the Low Average range. His Delayed Memory Index (DMI), a measure of his ability to recall verbal and visual information after a 20 to 30 minute delay, fell at the lower boundary of the Low Average range. The discrepancy between his ability to learn and retain orally-presented information (WMS-IV Auditory Memory Index: 9th percentile) compared to a measure of his verbal intellectual aptitude (WAIS-IV Verbal Comprehension Index: 93rd percentile) was striking. His delayed auditory recall was as expected given his initial level of encoding, which suggested that his auditory memory was most constrained at the stage of initial acquisition. His ability to learn and retain visual information (WMS-IV Visual Memory Index: 23rd percentile) also fell within the Low Average range. Contrast scores between his immediate and delayed visual recall performances indicated that he retained an expected amount information related to spatial locations though demonstrated abnormal forgetting of figural designs.  Executive functions, which were assessed through a variety of  tasks that required mental flexibility, organization, reasoning and/or planning skills, were for the most part an area of weakness. His working memory span (i.e., ability to hold and manipulate information within short-term memory) was somewhat lower than expected as he scored between the Low Average to Average ranges on tests that required him to repeat spoken digits in forward, reverse or ascending sequence (WAIS-IV Digit Span), solve mental calculations (WAIS-IV Arithmetic), immediately recognize symbols in left to right order (WMS-IV) Symbol Span) or immediately recall spatial locations within a grid (WMS-IV Spatial Addition). He performed within the Average range on tests of cognitive flexibility that required him to keep track of an alternating and ascending letter-number sequence (Trails B), name members of a category under time pressure (Animal Naming Test) or fluently generate unique designs within a set time period [employer] Figural Fluency Test). His ability to fluently generate words to designated letters (Controlled Oral Word Association Test) was within the High Average range. His performance on a test that required selective attention and response inhibition in order to efficiently name the color a word was printed in rather than reading the word itself that did not match the color (  Stroop Color Word Test) was below average, though in context of his slowed speeds to simply read words or name color hues, this result would be most indicative of generalized slowing rather than a loss of cognitive flexibility. His performances on test of abstract reasoning were highly discrepant as his ability to verbally classify ostensibly different objects or ideas to a shared category (WAIS-IV Similarities) fell within the Very Superior range but his performance on a test that required matching designs or symbols in an abstract manner (WAIS-IV Matrix Reasoning) fell within the Low Average range. Novel problem-solving was  unequivocally impaired. On a test that required inferring abstract rules to sort geometric designs [employer]), he was able to quickly identify the initial sorting principle but thereafter could not deduce any other sorting principles despite ongoing trial-to-trial feedback. Moreover, more than half of his errors were of a perseverative nature, which suggested a tendency to rigidly stick with an incorrect or previously correct strategy. On another conceptual reasoning test, he made an above average number of errors when attempting to infer abstract hypotheses to group or categorize novel geometric shapes (Short Category Test). In addition, he was observed to not consistently maintain set as manifested by deviating from a correct strategy for no apparent reason.   Qualitative observations of his language functioning did not suggest any problems for expression or oral comprehension. His ability to name to confrontation [facility] [employer]) was below expectations within the Low Average range. He was not able to name drawings of a compass, yoke, palette or abacus. As noted above, his ability to fluently generate words to a designated letter under time pressure (Controlled Oral Word Association Test) was within the High Average range. His ability to define words (WAIS-IV Vocabulary) was within the Superior range.  Visual perceptual and visual-spatial functioning was normal. There were no signs of spatial inattention or problems with visual recognition. He did show abnormally slowed speed on a test of visual scanning (Trails A) though scanning difficulty was not evident on other tests that required visual search. He performed within normal expectations on tests of visuospatial organization that required assembly of two-dimensional block designs from models Product manager), mental reconstruction of fragmented designs to match a whole (WAIS-IV Visual Puzzles) or drawing of a complex geometric  design Investment banker, corporate).  Perceptual-motor functions were not formally assessed. He demonstrated consistent right hand preference for writing and object manipulation. No problems with gross motor coordination were observed.  With regards to his emotional functioning, he reported a minimal level of current affective distress on standardized symptom questionnaires. His score of 6 on the Beck Depression Inventory-II was within the minimal or non-depressed range. No symptom was endorsed beyond 1 on a 0 - 3 scale. On the [facility], his score of 5 also fell within the normal or minimal range. Moreover, he noted that two of the three symptoms he had endorsed did not actually reflect anxiety.   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 He has not exhibited any concurrent changes in mood or personality. He continues to work as a Engineer, drilling. He reported that his father had exhibited memory loss when in his early 42 and eventually developed dementia.  Neuropsychological evaluation identified multiple indications of cognitive decline. His most prominent area of deficit was conceptual reasoning and problem-solving. 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 His general intellectual functioning fell within the Average range, which was interpreted as a decline from his pre-morbid level as estimated on the basis of his educational and occupational background. Only his verbal comprehension abilities, which fell within the Superior range, were commensurate with pre-morbid expectations. His memory functioning fell within the Low Average range, which represented a substantial decline from his pre-morbid level. Most striking, his ability to  learn and retain orally-presented information fell at 9th percentile in comparison to his verbal intellectual aptitude within the Superior range. His auditory-verbal memory was most constrained at the stage of initial acquisition as his delayed verbal recall was as expected given his level of initial encoding. On the other hand, his ability to retain visual information after a delay was variable. Other abilities that were within the Low Average range and therefore reduced from pre-morbid level included response speed, naming to confrontation and nonverbal reasoning. He mostly performed within the Average range on tests of visual processing speed, visual working memory, cognitive flexibility and visual-spatial organization/construction. The only measured cognitive skills that were clearly commensurate with pre-morbid expectations were his phonemic fluency, fund of word knowledge and verbal reasoning ability.  With regards to his psychological status, he reported a minimal level of affective distress at this time. Both he and his wife agreed that he has typically been in good spirits over the past two years. He reported a history of recurrent depressive episodes mostly occurring in the wintertime that have been under good control.  Review of Systems: Patient complains of symptoms per HPI as well as the following symptoms: No CP, no Fever, No SOB, no rash, no recent illnesses. Pertinent negatives per HPI. All others negative.   History   Social History  . Marital Status: Married    Spouse Name: Tammy  . Number of Children: 3  . Years of Education: College+   Occupational History  . Director of pediatric intensive care [employer]   Social History Main Topics  . Smoking status: Never Smoker   . Smokeless tobacco: Not on file  . Alcohol Use: 0.0 oz/week    0 Standard drinks or equivalent per week     Comment: daily use  . Drug Use: No  . Sexual Activity: Not on file   Other Topics Concern  .  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   Remarried - wife works at [employer] - Bilateral mastectomy for cancer in 2011   Has three daughters born in mid- late 45s - 2 at [employer] in [place] in 2012   Exwife in [place]   Caffeine use: 2 cups per day    Family History  Problem Relation Age of Onset  . Prostate cancer Father 56    Died of prostate cancer  . Atrial fibrillation Father     normal coronaries  . Hypertension Father     and brother    Past Medical History  Diagnosis Date  . Phlebitis   . Hypertension   . Depression     Past Surgical History  Procedure Laterality Date  . Appendectomy  1988    Current Outpatient Prescriptions  Medication Sig Dispense Refill  . atorvastatin (LIPITOR) 40 MG tablet Take 1 tablet (40 mg total) by mouth daily. 90 tablet 3  . buPROPion (WELLBUTRIN XL) 300 MG 24 hr tablet TAKE 1 TABLET BY MOUTH DAILY 90  tablet 3  . diltiazem (CARDIZEM) 30 MG tablet Take 30 mg by mouth as needed.    . donepezil (ARICEPT) 10 MG tablet Take 1 tablet (10 mg total) by mouth at bedtime. (Patient taking differently: Take 5 mg by mouth at bedtime. ) 30 tablet 11  . hydrochlorothiazide (HYDRODIURIL) 25 MG tablet Take 1 tablet (25 mg total) by mouth daily. Every morning 90 tablet 3  . lamoTRIgine (LAMICTAL) 200 MG tablet TAKE 1 TABLET BY MOUTH ONCE DAILY 90 tablet 2  . lisinopril (PRINIVIL,ZESTRIL) 10 MG tablet TAKE 1 TABLET BY MOUTH DAILY 90 tablet 1  . metoprolol succinate (TOPROL-XL) 25 MG 24 hr tablet TAKE 1 TABLET BY MOUTH DAILY 90 tablet 1  . PRISTIQ 50 MG 24 hr tablet TAKE 1 TABLET BY MOUTH ONCE DAILY 90 tablet 2   No current facility-administered medications for this visit.    Allergies as of 09/14/2014  . (No Known Allergies)    Vitals: BP 107/71 mmHg  Pulse 69  Temp(Src) 98.4 F (36.9 C)  Ht 6' (1.829 m)  Wt 216 lb (97.977 kg)  BMI 29.29 kg/m2 Last Weight:  Wt Readings from Last 1 Encounters:    09/14/14 216 lb (97.977 kg)   Last Height:   Ht Readings from Last 1 Encounters:  09/14/14 6' (1.829 m)    Physical exam: Exam: Gen: NAD, conversant, well nourised, well groomed                     CV: RRR, no MRG. No Carotid Bruits. No peripheral edema, warm, nontender Eyes: Conjunctivae clear without exudates or hemorrhage  Neuro: Detailed Neurologic Exam  Speech:    Speech is normal; fluent and spontaneous with normal comprehension.  Cognition:    The patient is oriented to person, place; Recent and remote memory impaired. Poor insight and judgement.    Cranial Nerves:    The pupils are equal, round, and reactive to light. Visual fields are full to finger confrontation. Extraocular movements are intact. Trigeminal sensation is intact and the muscles of mastication are normal. The face is symmetric. The palate elevates in the midline. Hearing intact. Voice is normal. Shoulder shrug is normal. The tongue has normal motion without fasciculations.  Strength:    Strength is V/V in the upper and lower limbs.         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 Neurocognitive testing does reveal declined cognitive ability based on his level of education and occupation, poor ability to acquire and retain new information, weak executive functioning. Agree with Dr. 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 Furthermore these deficits are progressive. Since he is less than 72, this is considered early onset neuro cognitive disorder. The PET scan completed showed hypometabolism in a patterm consistent with Alzheimers dementia. Had a long discussion with patient and his wife on  this diagnosis. Recommend that patient relinquish his driver's license.   His neurocognitive baseline continues to deteriorate more quickly than usually seen in this condition. He cannot perform complex tasks and is having difficulty writing checks and even performing other simple independent activities of daily living. He cannot manage finances. He cannot cook. His ability to learn and retain new information  is limited. He is having difficulty with tasks like using small appliances or setting up auto-pay for a bill. His deficits would interfere with independent living and his current cognitive status would easily place him 2 standard deviations below normal on formal cognitive testing  and therefore I would classify as Major Neurocognitive Impairment at this time.  Sarina Ill, MD  [facility] [address] [place], [place] [address]  Phone [phone] Fax [phone]  A total of 30 minutes was spent face-to-face with this patient. Over half this time was spent on counseling patient on the alzheimer's dementia diagnosis and different diagnostic and therapeutic options available.

## 2014-09-17 ENCOUNTER — Telehealth: Payer: Self-pay | Admitting: *Deleted

## 2014-09-17 NOTE — Telephone Encounter (Signed)
Patient form faxed to [facility] on 09/17/14.

## 2014-10-07 ENCOUNTER — Other Ambulatory Visit: Payer: Self-pay | Admitting: *Deleted

## 2014-10-07 ENCOUNTER — Other Ambulatory Visit: Payer: Self-pay | Admitting: Family Medicine

## 2014-10-08 ENCOUNTER — Other Ambulatory Visit: Payer: Self-pay | Admitting: Family Medicine

## 2014-10-08 MED ORDER — LISINOPRIL 10 MG PO TABS: 10.0000 mg | ORAL_TABLET | Freq: Every day | ORAL | Status: DC

## 2014-10-08 MED ORDER — ATORVASTATIN CALCIUM 40 MG PO TABS: 40.0000 mg | ORAL_TABLET | Freq: Every day | ORAL | Status: AC

## 2014-10-08 MED ORDER — METOPROLOL SUCCINATE ER 25 MG PO TB24: 25.0000 mg | ORAL_TABLET | Freq: Every day | ORAL | Status: AC

## 2014-10-16 ENCOUNTER — Other Ambulatory Visit: Payer: Self-pay | Admitting: Family Medicine

## 2014-11-10 ENCOUNTER — Telehealth: Payer: Self-pay

## 2014-11-10 NOTE — Telephone Encounter (Signed)
I spoke to the patient in re the CREAD study. Patient is interested. I emailed the patient the Informed Consent form where he can find out more info about the study.

## 2014-11-16 ENCOUNTER — Telehealth: Payer: Self-pay

## 2014-11-16 NOTE — Telephone Encounter (Signed)
I left a message for the patient to return my call.

## 2014-11-24 ENCOUNTER — Telehealth: Payer: Self-pay | Admitting: *Deleted

## 2014-11-24 DIAGNOSIS — Z0289 Encounter for other administrative examinations: Secondary | ICD-10-CM

## 2014-11-24 NOTE — Telephone Encounter (Signed)
Patient form on Emma desk. 

## 2014-11-26 ENCOUNTER — Telehealth: Payer: Self-pay

## 2014-11-26 NOTE — Telephone Encounter (Signed)
I spoke to the patient in re the CREAD study. Patient stated that his only concern about participating in the study was that he intends to move to [place], [place], and it would be difficult to make it to the appointments. He asked to be called back in two weeks.

## 2014-12-08 ENCOUNTER — Telehealth: Payer: Self-pay

## 2014-12-08 NOTE — Telephone Encounter (Signed)
I left a message for the patient to return my call.

## 2015-01-04 ENCOUNTER — Other Ambulatory Visit: Payer: Self-pay | Admitting: *Deleted

## 2015-01-04 ENCOUNTER — Telehealth: Payer: Self-pay | Admitting: *Deleted

## 2015-01-04 MED ORDER — BUPROPION HCL ER (XL) 300 MG PO TB24: 300.0000 mg | ORAL_TABLET | Freq: Every day | ORAL | Status: AC

## 2015-01-04 NOTE — Telephone Encounter (Signed)
Prior Authorization received from [facility] pharmacy for Pristiq Er 50 mg. PA was completed online at [email or web address].  PA was approved via [employer] until 01/04/2016.  PA case number [id number]. Clovis Pu, RN

## 2015-02-04 ENCOUNTER — Other Ambulatory Visit: Payer: Self-pay | Admitting: *Deleted

## 2015-02-04 MED ORDER — LISINOPRIL 10 MG PO TABS: 10.0000 mg | ORAL_TABLET | Freq: Every day | ORAL | Status: AC

## 2015-03-04 DIAGNOSIS — Z0271 Encounter for disability determination: Secondary | ICD-10-CM

## 2016-08-13 IMAGING — MR MR MRA HEAD W/O CM
10 of 13 series · 31 of 48 positions shown · IV contrast (multihance)
Comparison: None.

CLINICAL DATA: 63-year-old male with syncopal episodes and near
syncope. Symptoms for nearly 1 year. Initial encounter.

EXAM:
MRI HEAD WITHOUT AND WITH CONTRAST
MRA HEAD WITHOUT CONTRAST
TECHNIQUE: Multiplanar, multiecho pulse sequences of the brain and surrounding
structures were obtained without and with intravenous contrast.
Angiographic images of the head were obtained using MRA technique
without contrast.
CONTRAST:  19mL MULTIHANCE GADOBENATE DIMEGLUMINE 529 MG/ML IV SOLN

[Series 3: (id) mt fs · axial · 1.4mm · 0.39mm/px · z∈[-61,-47]mm · 2 of 139 slices shown]
[im 1/139]
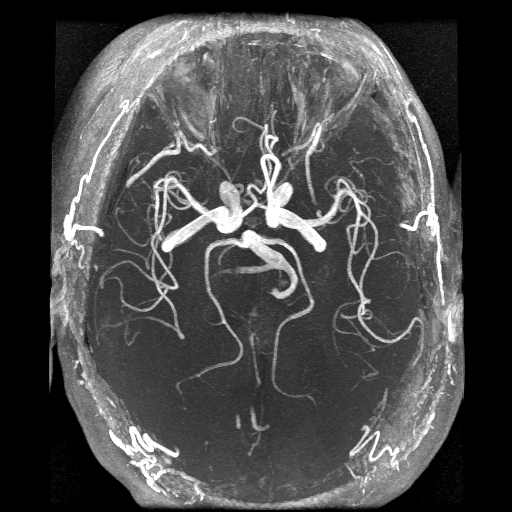
[im 18/139]
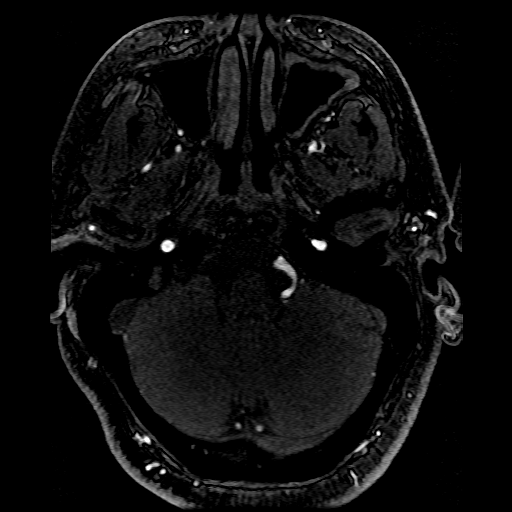

[Series 4: T1 · sagittal · 5.0mm · 0.47mm/px · 2 of 24 slices shown]
[im 1/24]
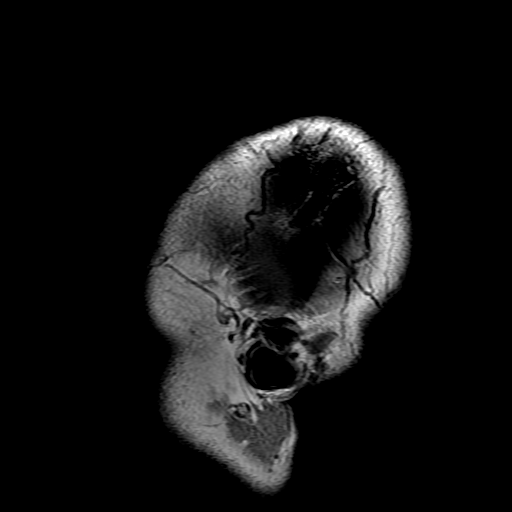
[im 24/24]
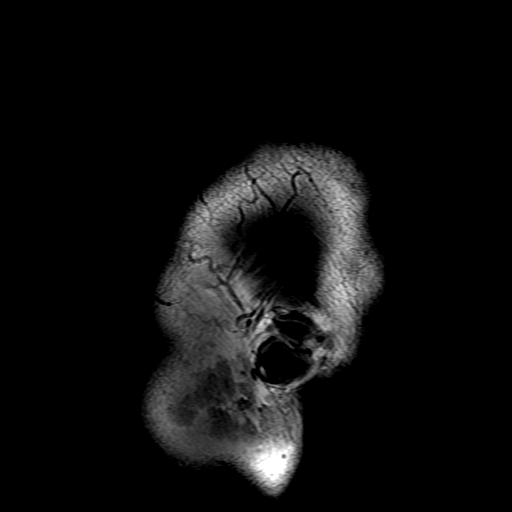

[Series 5: DWI · axial · 3.0mm · 1.09mm/px · z∈[-60,+98]mm · 8 of 108 slices shown (1 of 4)]
[im 1/108]
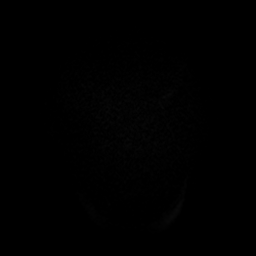
[im 16/108]
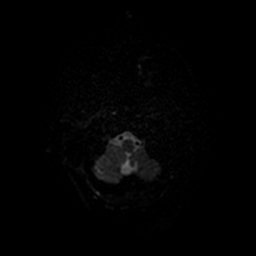
[im 31/108]
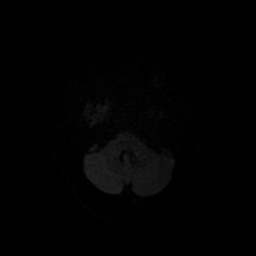
[im 46/108]
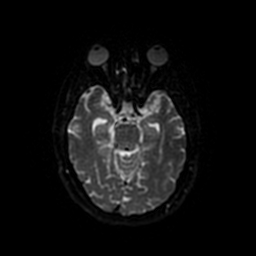
[im 62/108]
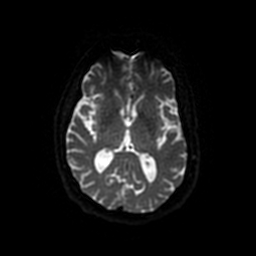
[im 77/108]
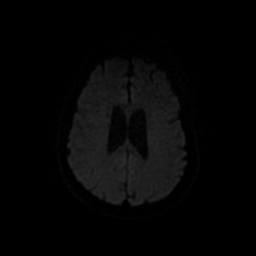
[im 92/108]
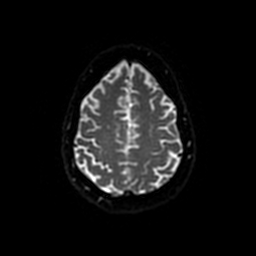
[im 108/108]
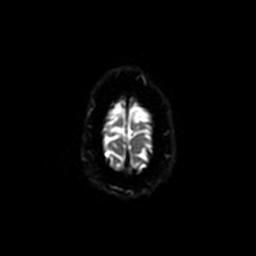

[Series 6: DWI · coronal · 5.0mm · 1.09mm/px · 5 of 66 slices shown (2 of 4)]
[im 1/66]
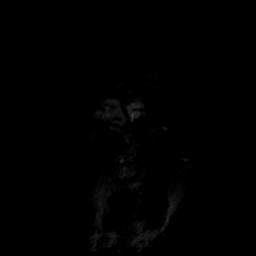
[im 17/66]
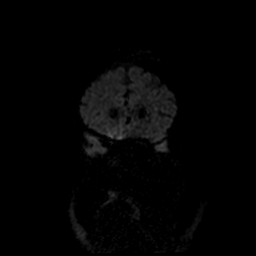
[im 33/66]
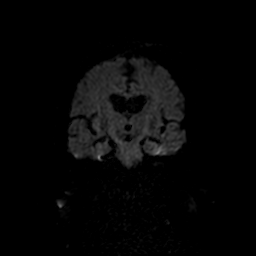
[im 49/66]
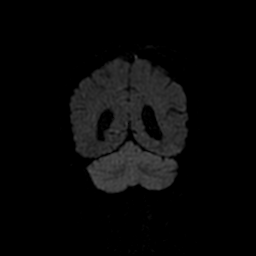
[im 66/66]
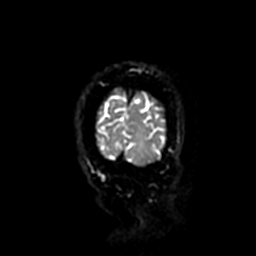

[Series 7: T2 · axial · 5.0mm · 0.43mm/px · z∈[-73,+87]mm · 2 of 26 slices shown]
[im 1/26]
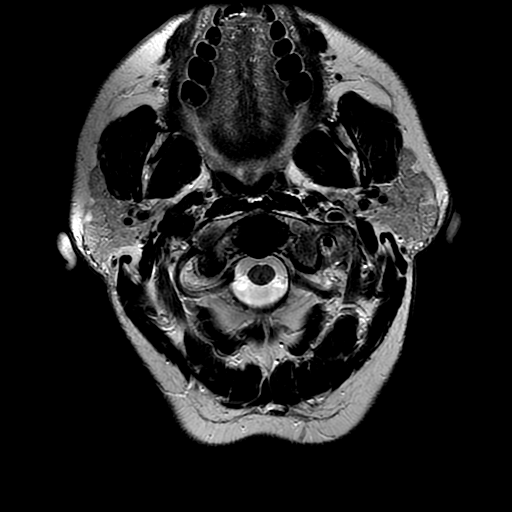
[im 26/26]
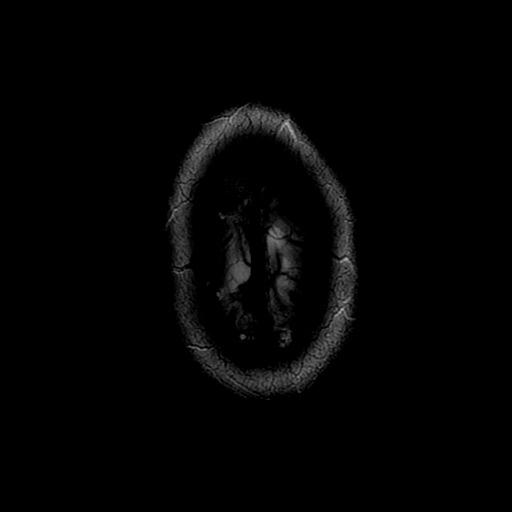

[Series 8: FLAIR · axial · 5.0mm · 0.43mm/px · z∈[-79,+93]mm · 2 of 26 slices shown]
[im 1/26]
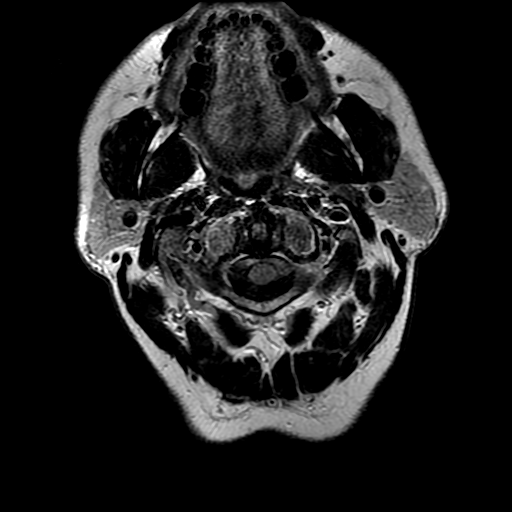
[im 26/26]
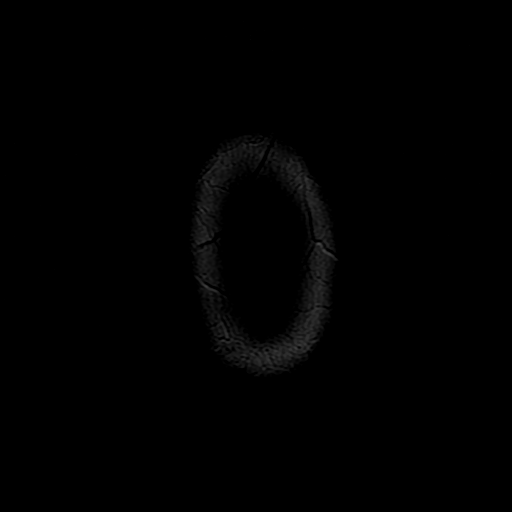

[Series 11: T2 post-contrast · coronal · 5.0mm · 0.45mm/px · 2 of 26 slices shown]
[im 1/26]
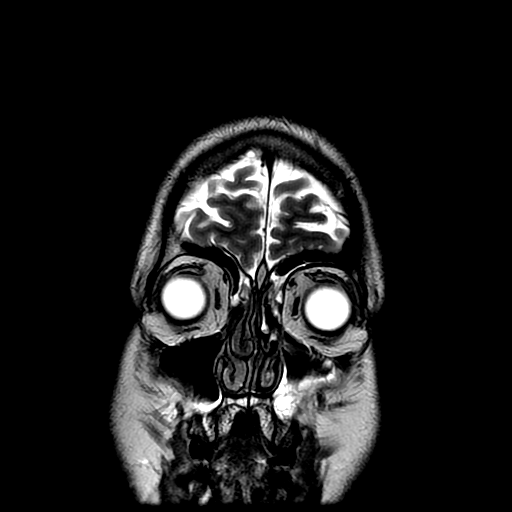
[im 26/26]
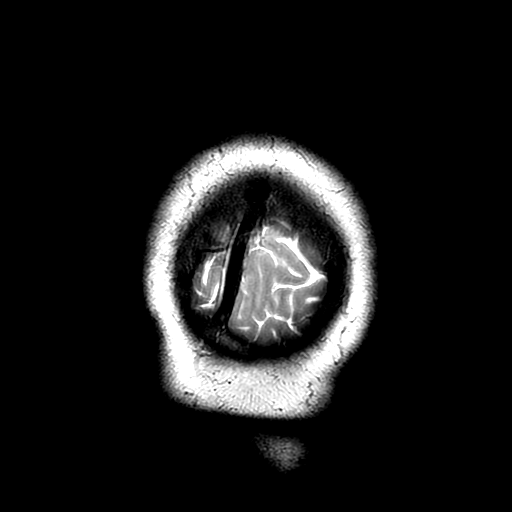

[Series 13: T1 post-contrast · coronal · 5.0mm · 0.45mm/px · 2 of 26 slices shown]
[im 1/26]
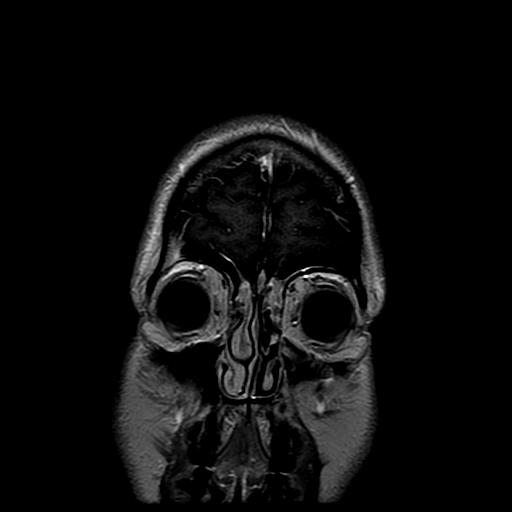
[im 26/26]
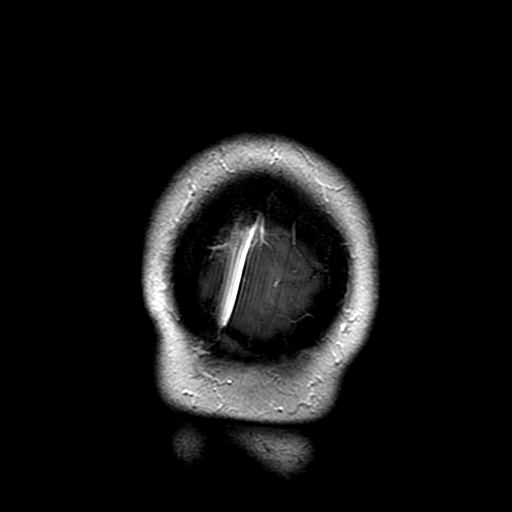

[Series 500: DWI · axial · 3.0mm · 1.09mm/px · z∈[-60,+98]mm · 4 of 54 slices shown (3 of 4)]
[im 1/54]
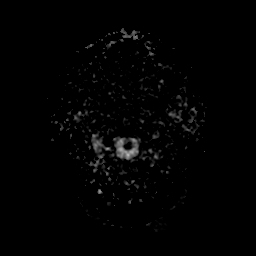
[im 18/54]
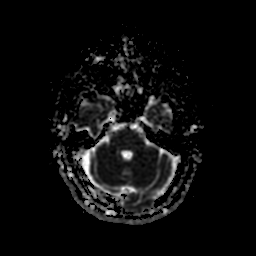
[im 36/54]
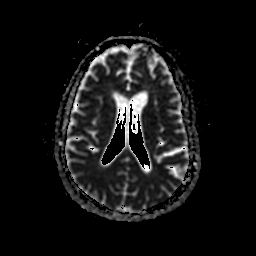
[im 54/54]
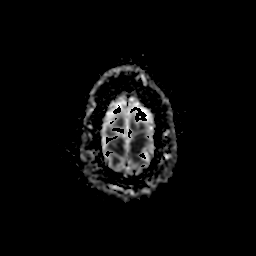

[Series 600: DWI · coronal · 5.0mm · 1.09mm/px · 2 of 33 slices shown (4 of 4)]
[im 1/33]
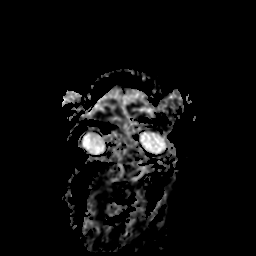
[im 33/33]
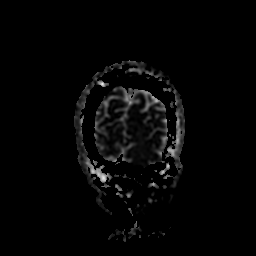

[31 of 48 positions shown; findings below may reference images not displayed]

FINDINGS: MRI HEAD FINDINGS

Cerebral volume is within normal limits for age. No restricted
diffusion to suggest acute infarction. No midline shift, mass
effect, evidence of mass lesion, ventriculomegaly, extra-axial
collection or acute intracranial hemorrhage. Cervicomedullary
junction and pituitary are within normal limits. Negative visualized
cervical spine. Major intracranial vascular flow voids are
preserved, there is a degree of generalized intracranial artery
dolichoectasia.

No cortical encephalomalacia. Gray and white matter signal is within
normal limits for age throughout the brain. No abnormal enhancement
identified.

Visible internal auditory structures appear normal. Mastoids are
clear. Moderate paranasal sinus mucosal thickening, ethmoids most
affected. Visualized orbit soft tissues are within normal limits.
Visualized scalp soft tissues are within normal limits. Normal bone
marrow signal.

MRA HEAD FINDINGS

Antegrade flow in the posterior circulation. Codominant distal
vertebral arteries. Normal PICA origins.

Dolichoectatic vertebrobasilar junction and basilar artery. AICA,
SCA and PCA origins are within normal limits. Posterior
communicating arteries are diminutive or absent. Bilateral PCA
branches are within normal limits.

Antegrade flow in both ICA siphons. Normal ophthalmic artery
origins. Mild dolichoectasia of the cavernous segments. No siphon
stenosis. Small infundibula at the left ICA terminus (series 303
image 8). Patent carotid termini with normal MCA and ACA origins.
Anterior communicating artery and visualized bilateral ACA branches
are within normal limits, the left ACA is dominant. Mild bilateral
MCA dolichoectasia. Visualized bilateral MCA branches otherwise
within normal limits.
IMPRESSION: 1.  Normal for age MRI appearance of the brain.
2. Negative intracranial MRA except for dolichoectasia, most
severely affecting the basilar artery.
3. Moderate paranasal sinus inflammation.

## 2019-07-03 DEATH — deceased
